# Patient Record
Sex: Female | Born: 1960
Health system: Southern US, Community
[De-identification: ages and names within clinical notes are randomized; demographics above are authoritative.]

## PROBLEM LIST (undated history)

## (undated) DIAGNOSIS — I10 Essential (primary) hypertension: Secondary | ICD-10-CM

## (undated) DIAGNOSIS — Z800 Family history of malignant neoplasm of digestive organs: Secondary | ICD-10-CM

## (undated) DIAGNOSIS — M1990 Unspecified osteoarthritis, unspecified site: Secondary | ICD-10-CM

## (undated) HISTORY — DX: Unspecified osteoarthritis, unspecified site: M19.90

## (undated) HISTORY — PX: TONSILLECTOMY AND ADENOIDECTOMY: SUR1326

## (undated) HISTORY — DX: Family history of malignant neoplasm of digestive organs: Z80.0

## (undated) HISTORY — DX: Essential (primary) hypertension: I10

## (undated) HISTORY — PX: COLONOSCOPY: SHX174

---

## 1997-11-06 HISTORY — PX: CERVICAL BIOPSY  W/ LOOP ELECTRODE EXCISION: SUR135

## 2005-09-07 ENCOUNTER — Ambulatory Visit: Payer: Self-pay | Admitting: Internal Medicine

## 2005-09-20 ENCOUNTER — Ambulatory Visit: Payer: Self-pay | Admitting: Internal Medicine

## 2006-01-19 ENCOUNTER — Ambulatory Visit: Payer: Self-pay

## 2006-02-05 ENCOUNTER — Ambulatory Visit: Payer: Self-pay

## 2007-06-18 ENCOUNTER — Ambulatory Visit: Payer: Self-pay | Admitting: Unknown Physician Specialty

## 2008-05-05 ENCOUNTER — Ambulatory Visit: Payer: Self-pay | Admitting: Internal Medicine

## 2009-06-09 ENCOUNTER — Ambulatory Visit: Payer: Self-pay

## 2011-11-21 ENCOUNTER — Ambulatory Visit: Payer: Self-pay

## 2013-02-12 ENCOUNTER — Encounter: Payer: Self-pay | Admitting: *Deleted

## 2013-02-14 ENCOUNTER — Encounter: Payer: Self-pay | Admitting: Cardiovascular Disease

## 2013-02-14 ENCOUNTER — Ambulatory Visit (INDEPENDENT_AMBULATORY_CARE_PROVIDER_SITE_OTHER): Payer: BC Managed Care – PPO | Admitting: Cardiovascular Disease

## 2013-02-14 VITALS — BP 160/80 | HR 65 | Ht 63.0 in | Wt 181.5 lb

## 2013-02-14 DIAGNOSIS — R0789 Other chest pain: Secondary | ICD-10-CM

## 2013-02-14 DIAGNOSIS — I10 Essential (primary) hypertension: Secondary | ICD-10-CM

## 2013-02-14 NOTE — Assessment & Plan Note (Signed)
Her blood pressure remains elevated but she seems to be anxious. She is already on amlodipine. If her blood pressure remains elevated, consider adding a thiazide diuretic.

## 2013-02-14 NOTE — Assessment & Plan Note (Signed)
Her chest pain is overall atypical. Physical exam is unremarkable and baseline ECG is normal. I recommend further evaluation with a treadmill stress test.

## 2013-02-14 NOTE — Patient Instructions (Addendum)
Your physician has requested that you have an exercise tolerance test. For further information please visit [email or web address]. Please also follow instruction sheet, as given.  Follow up as needed.  

## 2013-02-14 NOTE — Progress Notes (Signed)
Primary care physician: Dr. Venora Maples  HPI  This is a pleasant 52 year old Caucasian female who is referred for evaluation of chest pain. She does not have any previous cardiac history. She has minimal risk factors for coronary artery disease. She was diagnosed with hypertension early this year and was started on amlodipine. She has no history of diabetes, hyperlipidemia or tobacco use. There is family history of coronary artery disease but not premature. She describes mild chest discomfort and fatigue which can happen at rest and with physical activities. She feels that she cannot take a deep breath sometimes. She does complain of lower extremity edema at the end of the day. She does not exercise on a regular basis but stays active at work.  No Known Allergies   Current Outpatient Prescriptions on File Prior to Visit  Medication Sig Dispense Refill  . amLODipine (NORVASC) 5 MG tablet Take 5 mg by mouth daily.      . fexofenadine (ALLEGRA) 60 MG tablet Take 60 mg by mouth daily.      . Misc Natural Products (COMPLETE MENOPAUSE HEALTH PO) Take by mouth 2 (two) times daily.       No current facility-administered medications on file prior to visit.     Past Medical History  Diagnosis Date  . Unspecified essential hypertension      Past Surgical History  Procedure Laterality Date  . Colonoscopy    . Tonsillectomy and adenoidectomy       Family History  Problem Relation Age of Onset  . Hypertension Mother   . Hypertension Father   . Cancer - Colon Father      History   Social History  . Marital Status: Married    Spouse Name: N/A    Number of Children: N/A  . Years of Education: N/A   Occupational History  . Not on file.   Social History Main Topics  . Smoking status: Never Smoker   . Smokeless tobacco: Not on file  . Alcohol Use: No  . Drug Use: No  . Sexually Active: Not on file   Other Topics Concern  . Not on file   Social History Narrative  . No  narrative on file     ROS Constitutional: Negative for fever, chills, diaphoresis, activity change, appetite change and fatigue.  HENT: Negative for hearing loss, nosebleeds, congestion, sore throat, facial swelling, drooling, trouble swallowing, neck pain, voice change, sinus pressure and tinnitus.  Eyes: Negative for photophobia, pain, discharge and visual disturbance.  Respiratory: Negative for apnea, cough,  shortness of breath and wheezing.  Cardiovascular: Negative for palpitations and leg swelling.  Gastrointestinal: Negative for nausea, vomiting, abdominal pain, diarrhea, constipation, blood in stool and abdominal distention.  Genitourinary: Negative for dysuria, urgency, frequency, hematuria and decreased urine volume.  Musculoskeletal: Negative for myalgias, back pain, joint swelling, arthralgias and gait problem.  Skin: Negative for color change, pallor, rash and wound.  Neurological: Negative for dizziness, tremors, seizures, syncope, speech difficulty, weakness, light-headedness, numbness and headaches.  Psychiatric/Behavioral: Negative for suicidal ideas, hallucinations, behavioral problems and agitation. The patient is not nervous/anxious.     PHYSICAL EXAM   BP 160/80  Pulse 65  Ht 5\' 3"  (1.6 m)  Wt 181 lb 8 oz (82.328 kg)  BMI 32.16 kg/m2 Constitutional: She is oriented to person, place, and time. She appears well-developed and well-nourished. No distress.  HENT: No nasal discharge.  Head: Normocephalic and atraumatic.  Eyes: Pupils are equal and round. Right eye exhibits  no discharge. Left eye exhibits no discharge.  Neck: Normal range of motion. Neck supple. No JVD present. No thyromegaly present.  Cardiovascular: Normal rate, regular rhythm, normal heart sounds. Exam reveals no gallop and no friction rub. No murmur heard.  Pulmonary/Chest: Effort normal and breath sounds normal. No stridor. No respiratory distress. She has no wheezes. She has no rales. She  exhibits no tenderness.  Abdominal: Soft. Bowel sounds are normal. She exhibits no distension. There is no tenderness. There is no rebound and no guarding.  Musculoskeletal: Normal range of motion. She exhibits no edema and no tenderness.  Neurological: She is alert and oriented to person, place, and time. Coordination normal.  Skin: Skin is warm and dry. No rash noted. She is not diaphoretic. No erythema. No pallor.  Psychiatric: She has a normal mood and affect. Her behavior is normal. Judgment and thought content normal.     [id number]  Rhythm  Low voltage in precordial leads.   ABNORMAL     ASSESSMENT AND PLAN

## 2013-03-11 ENCOUNTER — Encounter: Payer: Self-pay | Admitting: *Deleted

## 2013-03-18 ENCOUNTER — Ambulatory Visit (INDEPENDENT_AMBULATORY_CARE_PROVIDER_SITE_OTHER): Payer: BC Managed Care – PPO | Admitting: Cardiovascular Disease

## 2013-03-18 ENCOUNTER — Encounter: Payer: Self-pay | Admitting: Cardiovascular Disease

## 2013-03-18 VITALS — BP 121/70 | HR 65 | Ht 63.0 in | Wt 179.0 lb

## 2013-03-18 DIAGNOSIS — R0789 Other chest pain: Secondary | ICD-10-CM

## 2013-03-18 NOTE — Patient Instructions (Addendum)
Your stress test is borderline   Your physician has requested that you have a stress echocardiogram. For further information please visit [email or web address] Please follow instruction sheet as given.

## 2013-03-18 NOTE — Procedures (Signed)
    Treadmill Stress test  Indication: Chest pain and dyspnea  Baseline Data:  Resting EKG shows NSR with rate of 78 bpm, no significant ST changes. Resting blood pressure of 152/80 mm Hg Stand bruce protocal was used.  Exercise Data:  Patient exercised for 6 min 23 sec,  Peak heart rate of 146 bpm.  This was 87 % of the maximum predicted heart rate. No symptoms of chest pain or lightheadedness were reported at peak stress or in recovery.  Peak Blood pressure recorded was 188/82 Maximal work level: 7 METs.  Heart rate at 3 minutes in recovery was 85 bpm. BP response: Normal HR response: Normal  EKG with Exercise: Sinus tachycardia with 1 mm of horizontal ST depression in the inferior leads as well as V5 to V6. This resolved within 3 minutes of recovery  FINAL IMPRESSION: Abnormal exercise stress test. Ischemic EKG changes in the inferior leads without any associated chest pain. Good exercise tolerance.  Recommendation: This could be a false-positive test especially that she is female. I had a prolonged discussion with her about further management options. I recommend a stress echocardiogram.

## 2013-05-08 ENCOUNTER — Other Ambulatory Visit: Payer: BC Managed Care – PPO | Admitting: Cardiovascular Disease

## 2013-05-08 ENCOUNTER — Other Ambulatory Visit (INDEPENDENT_AMBULATORY_CARE_PROVIDER_SITE_OTHER): Payer: BC Managed Care – PPO

## 2013-05-08 ENCOUNTER — Other Ambulatory Visit: Payer: BC Managed Care – PPO

## 2013-05-08 DIAGNOSIS — R0789 Other chest pain: Secondary | ICD-10-CM

## 2013-05-08 DIAGNOSIS — R079 Chest pain, unspecified: Secondary | ICD-10-CM

## 2013-05-13 ENCOUNTER — Encounter: Payer: Self-pay | Admitting: Cardiovascular Disease

## 2013-05-29 ENCOUNTER — Encounter: Payer: Self-pay | Admitting: Cardiovascular Disease

## 2015-06-22 LAB — HM MAMMOGRAPHY: HM Mammogram: NEGATIVE

## 2015-06-29 ENCOUNTER — Encounter: Payer: Self-pay | Admitting: Family Medicine

## 2015-06-29 ENCOUNTER — Ambulatory Visit (INDEPENDENT_AMBULATORY_CARE_PROVIDER_SITE_OTHER): Payer: BLUE CROSS/BLUE SHIELD | Admitting: Family Medicine

## 2015-06-29 VITALS — BP 150/75 | HR 56 | Temp 97.8°F | Resp 16 | Ht 62.0 in | Wt 179.2 lb

## 2015-06-29 DIAGNOSIS — M8949 Other hypertrophic osteoarthropathy, multiple sites: Secondary | ICD-10-CM

## 2015-06-29 DIAGNOSIS — M1990 Unspecified osteoarthritis, unspecified site: Secondary | ICD-10-CM | POA: Insufficient documentation

## 2015-06-29 DIAGNOSIS — I10 Essential (primary) hypertension: Secondary | ICD-10-CM | POA: Diagnosis not present

## 2015-06-29 DIAGNOSIS — M159 Polyosteoarthritis, unspecified: Secondary | ICD-10-CM

## 2015-06-29 DIAGNOSIS — M150 Primary generalized (osteo)arthritis: Secondary | ICD-10-CM | POA: Diagnosis not present

## 2015-06-29 MED ORDER — MELOXICAM 7.5 MG PO TABS: 7.5000 mg | ORAL_TABLET | Freq: Every day | ORAL | Status: DC

## 2015-06-29 MED ORDER — AMLODIPINE BESYLATE 2.5 MG PO TABS: 2.5000 mg | ORAL_TABLET | Freq: Every day | ORAL | Status: DC

## 2015-06-29 NOTE — Patient Instructions (Signed)
Continue other medications.  Discussed continued weight loss.

## 2015-06-29 NOTE — Progress Notes (Signed)
Name: [name]   MRN: [id number]    DOB: 1961-02-12   Date:06/29/2015       Progress Note  Subjective  Chief Complaint  Chief Complaint  Patient presents with  . Hypertension  . Arthritis    hand- refill meloxicam     HPI  Here for f/u of HBP.  C/o some increased OA pain, esp. In hands.Otherwise feeling well.  No problem-specific assessment & plan notes found for this encounter.   Past Medical History  Diagnosis Date  . Unspecified essential hypertension     Social History  Substance Use Topics  . Smoking status: Never Smoker   . Smokeless tobacco: Never Used  . Alcohol Use: No     Current outpatient prescriptions:  Marland Kitchen  Dermatological Products, Misc. (NUVAIL) SOLN, , Disp: , Rfl: 10 .  fexofenadine (ALLEGRA) 60 MG tablet, Take 60 mg by mouth daily., Disp: , Rfl:  .  Fish Oil-Cholecalciferol (FISH OIL + D3 PO), Take by mouth., Disp: , Rfl:  .  hydrochlorothiazide (HYDRODIURIL) 25 MG tablet, , Disp: , Rfl: 7 .  losartan (COZAAR) 100 MG tablet, , Disp: , Rfl: 2 .  meloxicam (MOBIC) 7.5 MG tablet, Take 1 tablet (7.5 mg total) by mouth daily., Disp: 30 tablet, Rfl: 3 .  Misc Natural Products (COMPLETE MENOPAUSE HEALTH PO), Take by mouth 2 (two) times daily., Disp: , Rfl:  .  naproxen sodium (ANAPROX) 220 MG tablet, Take 220 mg by mouth as needed., Disp: , Rfl:   No Known Allergies  Review of Systems  Constitutional: Negative for fever and chills.  HENT: Negative for hearing loss.   Eyes: Negative for blurred vision and double vision.  Respiratory: Negative for cough, sputum production, shortness of breath and wheezing.   Cardiovascular: Negative for chest pain, palpitations, orthopnea and leg swelling.  Gastrointestinal: Negative for heartburn, nausea, vomiting, abdominal pain and blood in stool.  Genitourinary: Negative for dysuria, urgency and frequency.  Musculoskeletal: Positive for joint pain. Negative for myalgias.  Skin: Negative for rash.  Neurological:  Negative for dizziness, tingling and headaches.  Psychiatric/Behavioral: Negative for depression. The patient is not nervous/anxious.        Objective  Filed Vitals:   06/29/15 0842  BP: 143/76  Pulse: 56  Temp: 97.8 F (36.6 C)  Resp: 16  Height: 5\' 2"  (1.575 m)  Weight: 179 lb 3.2 oz (81.285 kg)     Physical Exam  Constitutional: She is oriented to person, place, and time and well-developed, well-nourished, and in no distress. No distress.  HENT:  Head: Normocephalic and atraumatic.  Eyes: Conjunctivae and EOM are normal. Pupils are equal, round, and reactive to light. No scleral icterus.  Neck: Normal range of motion. Neck supple. Carotid bruit is not present. No thyromegaly present.  Cardiovascular: Normal rate, regular rhythm, normal heart sounds and intact distal pulses.  Exam reveals no gallop and no friction rub.   No murmur heard. Pulmonary/Chest: Effort normal and breath sounds normal. No respiratory distress. She has no wheezes. She has no rales.  Abdominal: Soft. Bowel sounds are normal. She exhibits no distension and no mass. There is no tenderness.  Musculoskeletal: Normal range of motion. She exhibits no edema.  Lymphadenopathy:    She has no cervical adenopathy.  Neurological: She is alert and oriented to person, place, and time.  Vitals reviewed.      No results found for this or any previous visit (from the past 2160 hour(s)).   Assessment & Plan  1. Essential hypertension  - amLODipine (NORVASC) 2.5 MG tablet; Take 1 tablet (2.5 mg total) by mouth daily.  Dispense: 90 tablet; Refill: 3  2. Primary osteoarthritis involving multiple joints

## 2015-08-06 ENCOUNTER — Other Ambulatory Visit: Payer: Self-pay | Admitting: Family Medicine

## 2015-08-10 ENCOUNTER — Ambulatory Visit (INDEPENDENT_AMBULATORY_CARE_PROVIDER_SITE_OTHER): Payer: BLUE CROSS/BLUE SHIELD | Admitting: Family Medicine

## 2015-08-10 ENCOUNTER — Encounter: Payer: Self-pay | Admitting: Family Medicine

## 2015-08-10 VITALS — BP 120/72 | HR 62 | Temp 98.2°F | Resp 16 | Ht 63.0 in | Wt 179.8 lb

## 2015-08-10 DIAGNOSIS — Z23 Encounter for immunization: Secondary | ICD-10-CM | POA: Diagnosis not present

## 2015-08-10 DIAGNOSIS — M8949 Other hypertrophic osteoarthropathy, multiple sites: Secondary | ICD-10-CM

## 2015-08-10 DIAGNOSIS — I10 Essential (primary) hypertension: Secondary | ICD-10-CM

## 2015-08-10 DIAGNOSIS — M150 Primary generalized (osteo)arthritis: Secondary | ICD-10-CM | POA: Diagnosis not present

## 2015-08-10 DIAGNOSIS — M159 Polyosteoarthritis, unspecified: Secondary | ICD-10-CM

## 2015-08-10 MED ORDER — HYDROCHLOROTHIAZIDE 25 MG PO TABS: 25.0000 mg | ORAL_TABLET | Freq: Every day | ORAL | Status: DC

## 2015-08-10 MED ORDER — AMLODIPINE BESYLATE 2.5 MG PO TABS: 2.5000 mg | ORAL_TABLET | Freq: Every day | ORAL | Status: DC

## 2015-08-10 MED ORDER — LOSARTAN POTASSIUM 100 MG PO TABS: 100.0000 mg | ORAL_TABLET | Freq: Every day | ORAL | Status: DC

## 2015-08-10 NOTE — Progress Notes (Signed)
Name: [name]   MRN: [id number]    DOB: 11-21-1960   Date:08/10/2015       Progress Note  Subjective  Chief Complaint  Chief Complaint  Patient presents with  . Hypertension    HPI Here for f/u of HBP.  Feling well overall.  Some hand arthritis problems on and off.    No problem-specific assessment & plan notes found for this encounter.   Past Medical History  Diagnosis Date  . Unspecified essential hypertension     Social History  Substance Use Topics  . Smoking status: Never Smoker   . Smokeless tobacco: Never Used  . Alcohol Use: No     Current outpatient prescriptions:  .  amLODipine (NORVASC) 2.5 MG tablet, Take 1 tablet (2.5 mg total) by mouth daily., Disp: 90 tablet, Rfl: 3 .  Dermatological Products, Misc. (NUVAIL) SOLN, , Disp: , Rfl: 10 .  fexofenadine (ALLEGRA) 60 MG tablet, Take 60 mg by mouth daily., Disp: , Rfl:  .  Fish Oil-Cholecalciferol (FISH OIL + D3 PO), Take by mouth., Disp: , Rfl:  .  hydrochlorothiazide (HYDRODIURIL) 25 MG tablet, , Disp: , Rfl: 7 .  losartan (COZAAR) 100 MG tablet, TAKE 1 TABLET BY MOUTH EVERY DAY, Disp: 30 tablet, Rfl: 12 .  meloxicam (MOBIC) 7.5 MG tablet, Take 1 tablet (7.5 mg total) by mouth daily., Disp: 30 tablet, Rfl: 3 .  Misc Natural Products (COMPLETE MENOPAUSE HEALTH PO), Take by mouth 2 (two) times daily., Disp: , Rfl:  .  naproxen sodium (ANAPROX) 220 MG tablet, Take 220 mg by mouth as needed., Disp: , Rfl:   No Known Allergies  Review of Systems  Constitutional: Negative for fever, chills, weight loss and malaise/fatigue.  HENT: Negative for hearing loss.   Eyes: Negative for blurred vision and double vision.  Respiratory: Negative for cough, sputum production, shortness of breath and wheezing.   Cardiovascular: Negative for chest pain, palpitations, orthopnea and leg swelling.  Gastrointestinal: Negative for heartburn, abdominal pain and blood in stool.  Genitourinary: Negative for dysuria, urgency and  frequency.  Musculoskeletal: Positive for joint pain (hands, on and off.). Negative for myalgias.  Skin: Negative for rash.  Neurological: Negative for dizziness, sensory change, focal weakness, weakness and headaches.      Objective  Filed Vitals:   08/10/15 0810  BP: 120/72  Pulse: 62  Temp: 98.2 F (36.8 C)  TempSrc: Oral  Resp: 16  Height: 5\' 3"  (1.6 m)  Weight: 179 lb 12.8 oz (81.557 kg)     Physical Exam  Constitutional: She is oriented to person, place, and time and well-developed, well-nourished, and in no distress. No distress.  HENT:  Head: Normocephalic and atraumatic.  Eyes: Conjunctivae and EOM are normal. Pupils are equal, round, and reactive to light.  Neck: Normal range of motion. Neck supple. Carotid bruit is not present. No thyromegaly present.  Cardiovascular: Normal rate, regular rhythm, normal heart sounds and intact distal pulses.  Exam reveals no gallop and no friction rub.   No murmur heard. Pulmonary/Chest: Effort normal and breath sounds normal. No respiratory distress. She has no wheezes. She has no rales.  Abdominal: Soft. Bowel sounds are normal. She exhibits no distension, no abdominal bruit and no mass. There is no tenderness.  Musculoskeletal: She exhibits no edema.  Lymphadenopathy:    She has no cervical adenopathy.  Neurological: She is alert and oriented to person, place, and time.  Vitals reviewed.     No results found for  this or any previous visit (from the past 2160 hour(s)).   Assessment & Plan  1. Essential hypertension  - amLODipine (NORVASC) 2.5 MG tablet; Take 1 tablet (2.5 mg total) by mouth daily.  Dispense: 90 tablet; Refill: 3 - hydrochlorothiazide (HYDRODIURIL) 25 MG tablet; Take 1 tablet (25 mg total) by mouth daily.  Dispense: 90 tablet; Refill: 3 - losartan (COZAAR) 100 MG tablet; Take 1 tablet (100 mg total) by mouth daily.  Dispense: 90 tablet; Refill: 3  2. Primary osteoarthritis involving multiple  joints Use Mobic prn  3. Need for influenza vaccination  - Flu Vaccine QUAD 36+ mos PF IM (Fluarix & Fluzone Quad PF)

## 2015-08-10 NOTE — Patient Instructions (Signed)
Continue current meds.  Discussed trying more to lose weight.

## 2015-10-22 ENCOUNTER — Other Ambulatory Visit: Payer: Self-pay | Admitting: Family Medicine

## 2015-11-24 ENCOUNTER — Encounter: Payer: Self-pay | Admitting: Family Medicine

## 2015-12-16 ENCOUNTER — Encounter: Payer: Self-pay | Admitting: Family Medicine

## 2015-12-16 ENCOUNTER — Ambulatory Visit (INDEPENDENT_AMBULATORY_CARE_PROVIDER_SITE_OTHER): Payer: BLUE CROSS/BLUE SHIELD | Admitting: Family Medicine

## 2015-12-16 VITALS — BP 145/70 | HR 60 | Temp 97.4°F | Resp 16 | Ht 63.0 in | Wt 179.2 lb

## 2015-12-16 DIAGNOSIS — M150 Primary generalized (osteo)arthritis: Secondary | ICD-10-CM

## 2015-12-16 DIAGNOSIS — M159 Polyosteoarthritis, unspecified: Secondary | ICD-10-CM

## 2015-12-16 DIAGNOSIS — I10 Essential (primary) hypertension: Secondary | ICD-10-CM | POA: Diagnosis not present

## 2015-12-16 DIAGNOSIS — H9193 Unspecified hearing loss, bilateral: Secondary | ICD-10-CM | POA: Diagnosis not present

## 2015-12-16 DIAGNOSIS — H9190 Unspecified hearing loss, unspecified ear: Secondary | ICD-10-CM | POA: Insufficient documentation

## 2015-12-16 DIAGNOSIS — M8949 Other hypertrophic osteoarthropathy, multiple sites: Secondary | ICD-10-CM

## 2015-12-16 MED ORDER — AMLODIPINE BESYLATE 5 MG PO TABS: 5.0000 mg | ORAL_TABLET | Freq: Every day | ORAL | Status: DC

## 2015-12-16 NOTE — Progress Notes (Signed)
Name: [name]   MRN: [id number]    DOB: [birth year]   Date:12/16/2015       Progress Note  Subjective  Chief Complaint  Chief Complaint  Patient presents with  . Hypertension    Follow up    Hypertension Pertinent negatives include no blurred vision, chest pain, headaches, malaise/fatigue, palpitations or shortness of breath.   Here for f/u of HBP.  Feeling well.  Taking all m eds.  Arthritis bothers some.  Takes Tylenol and occ. Meloxicam.  OBTW- C/o decreased hearing and full feeling in ears.  Having some dental pocedures done.  No problem-specific assessment & plan notes found for this encounter.   Past Medical History  Diagnosis Date  . Unspecified essential hypertension     Past Surgical History  Procedure Laterality Date  . Colonoscopy    . Tonsillectomy and adenoidectomy      Family History  Problem Relation Age of Onset  . Hypertension Mother   . Hypertension Father   . Cancer - Colon Father     Social History   Social History  . Marital Status: Married    Spouse Name: N/A  . Number of Children: N/A  . Years of Education: N/A   Occupational History  . Not on file.   Social History Main Topics  . Smoking status: Never Smoker   . Smokeless tobacco: Never Used  . Alcohol Use: No  . Drug Use: No  . Sexual Activity: Not on file   Other Topics Concern  . Not on file   Social History Narrative     Current outpatient prescriptions:  .  amLODipine (NORVASC) 5 MG tablet, Take 1 tablet (5 mg total) by mouth daily., Disp: 90 tablet, Rfl: 3 .  Dermatological Products, Misc. (NUVAIL) SOLN, , Disp: , Rfl: 10 .  fexofenadine (ALLEGRA) 60 MG tablet, Take 60 mg by mouth daily., Disp: , Rfl:  .  Fish Oil-Cholecalciferol (FISH OIL + D3 PO), Take by mouth., Disp: , Rfl:  .  hydrochlorothiazide (HYDRODIURIL) 25 MG tablet, Take 1 tablet (25 mg total) by mouth daily., Disp: 90 tablet, Rfl: 3 .  losartan (COZAAR) 100 MG tablet, Take 1 tablet (100 mg total) by  mouth daily., Disp: 90 tablet, Rfl: 3 .  meloxicam (MOBIC) 7.5 MG tablet, TAKE 1 TABLET(7.5 MG) BY MOUTH DAILY, Disp: 30 tablet, Rfl: 3 .  naproxen sodium (ANAPROX) 220 MG tablet, Take 220 mg by mouth as needed. Reported on 12/16/2015, Disp: , Rfl:   Not on File   Review of Systems  Constitutional: Negative for fever, chills, weight loss and malaise/fatigue.  HENT: Negative for hearing loss.   Eyes: Negative for blurred vision and double vision.  Respiratory: Negative for cough, shortness of breath and wheezing.   Cardiovascular: Positive for leg swelling (L ankle swelling some.). Negative for chest pain and palpitations.  Gastrointestinal: Negative for heartburn, nausea, abdominal pain and blood in stool.  Genitourinary: Negative for dysuria, urgency and frequency.  Musculoskeletal: Positive for joint pain (occ. hand arthritis).  Skin: Negative for rash.  Neurological: Negative for tremors, weakness and headaches.      Objective  Filed Vitals:   12/16/15 0817 12/16/15 0839  BP: 146/80 145/70  Pulse: 60   Temp: 97.4 F (36.3 C)   TempSrc: Oral   Resp: 16   Height: 5\' 3"  (1.6 m)   Weight: 179 lb 3.2 oz (81.285 kg)     Physical Exam  Constitutional: She is oriented to person, place,  and time and well-developed, well-nourished, and in no distress. No distress.  HENT:  Head: Normocephalic and atraumatic.  Right Ear: Tympanic membrane, external ear and ear canal normal. Decreased hearing is noted.  Left Ear: Tympanic membrane, external ear and ear canal normal. Decreased hearing is noted.  Eyes: Conjunctivae and EOM are normal. Pupils are equal, round, and reactive to light.  Neck: Normal range of motion. Neck supple. Carotid bruit is not present. No thyromegaly present.  Cardiovascular: Normal rate, regular rhythm and normal heart sounds.  Exam reveals no gallop and no friction rub.   No murmur heard. Pulmonary/Chest: Effort normal and breath sounds normal. No respiratory  distress. She has no wheezes. She has no rales.  Abdominal: Soft. She exhibits no distension, no abdominal bruit and no mass. There is no tenderness.  Musculoskeletal: She exhibits edema (trace edema R ankle).  Lymphadenopathy:    She has no cervical adenopathy.  Neurological: She is alert and oriented to person, place, and time.  Vitals reviewed.      No results found for this or any previous visit (from the past 2160 hour(s)).   Assessment & Plan  Problem List Items Addressed This Visit      Cardiovascular and Mediastinum   Essential hypertension - Primary   Relevant Medications   amLODipine (NORVASC) 5 MG tablet     Musculoskeletal and Integument   Osteoarthritis     Other   Decreased hearing   Relevant Orders   Ambulatory referral to ENT      Meds ordered this encounter  Medications  . amLODipine (NORVASC) 5 MG tablet    Sig: Take 1 tablet (5 mg total) by mouth daily.    Dispense:  90 tablet    Refill:  3  1. Essential hypertension Cont. HCTZ and Losartan - amLODipine (NORVASC) 5 MG tablet; Take 1 tablet (5 mg total) by mouth daily.  Dispense: 90 tablet; Refill: 3  2. Primary osteoarthritis involving multiple joints Cont. tylenol and rare Meloxicam  3. Decreased hearing, bilateral  - Ambulatory referral to ENT

## 2016-02-23 ENCOUNTER — Other Ambulatory Visit: Payer: Self-pay | Admitting: Family Medicine

## 2016-03-02 ENCOUNTER — Encounter: Payer: Self-pay | Admitting: Family Medicine

## 2016-03-02 ENCOUNTER — Ambulatory Visit (INDEPENDENT_AMBULATORY_CARE_PROVIDER_SITE_OTHER): Payer: BLUE CROSS/BLUE SHIELD | Admitting: Family Medicine

## 2016-03-02 VITALS — BP 130/70 | HR 57 | Resp 16 | Ht 63.0 in | Wt 177.0 lb

## 2016-03-02 DIAGNOSIS — M150 Primary generalized (osteo)arthritis: Secondary | ICD-10-CM

## 2016-03-02 DIAGNOSIS — I10 Essential (primary) hypertension: Secondary | ICD-10-CM

## 2016-03-02 DIAGNOSIS — M8949 Other hypertrophic osteoarthropathy, multiple sites: Secondary | ICD-10-CM

## 2016-03-02 DIAGNOSIS — M159 Polyosteoarthritis, unspecified: Secondary | ICD-10-CM

## 2016-03-02 MED ORDER — AMLODIPINE BESYLATE 2.5 MG PO TABS: 2.5000 mg | ORAL_TABLET | Freq: Every day | ORAL | Status: DC

## 2016-03-02 NOTE — Progress Notes (Signed)
Name: [name]   MRN: [id number]    DOB: 03-18-1961   Date:03/02/2016       Progress Note  Subjective  Chief Complaint  Chief Complaint  Patient presents with  . Hypertension    HPI Here for f/u of HBP.  Feeling well overall.  Gets swelling in feet and adjusts dose of Amlodipine between 2.5 mg and 5 mg.  No problem-specific assessment & plan notes found for this encounter.   Past Medical History  Diagnosis Date  . Unspecified essential hypertension     Past Surgical History  Procedure Laterality Date  . Colonoscopy    . Tonsillectomy and adenoidectomy      Family History  Problem Relation Age of Onset  . Hypertension Mother   . Hypertension Father   . Cancer - Colon Father     Social History   Social History  . Marital Status: Married    Spouse Name: N/A  . Number of Children: N/A  . Years of Education: N/A   Occupational History  . Not on file.   Social History Main Topics  . Smoking status: Never Smoker   . Smokeless tobacco: Never Used  . Alcohol Use: No  . Drug Use: No  . Sexual Activity: Not on file   Other Topics Concern  . Not on file   Social History Narrative     Current outpatient prescriptions:  .  acetaminophen (TYLENOL) 325 MG tablet, Take 650 mg by mouth every 6 (six) hours as needed., Disp: , Rfl:  .  amLODipine (NORVASC) 2.5 MG tablet, Take 1 tablet (2.5 mg total) by mouth daily., Disp: 90 tablet, Rfl: 3 .  Dermatological Products, Misc. (NUVAIL) SOLN, , Disp: , Rfl: 10 .  fexofenadine (ALLEGRA) 60 MG tablet, Take 60 mg by mouth daily., Disp: , Rfl:  .  Fish Oil-Cholecalciferol (FISH OIL + D3 PO), Take by mouth., Disp: , Rfl:  .  fluticasone (FLONASE) 50 MCG/ACT nasal spray, , Disp: , Rfl: 7 .  hydrochlorothiazide (HYDRODIURIL) 25 MG tablet, Take 1 tablet (25 mg total) by mouth daily., Disp: 90 tablet, Rfl: 3 .  losartan (COZAAR) 100 MG tablet, Take 1 tablet (100 mg total) by mouth daily., Disp: 90 tablet, Rfl: 3 .  meloxicam  (MOBIC) 7.5 MG tablet, TAKE 1 TABLET(7.5 MG) BY MOUTH DAILY (Patient taking differently: take daily as needed), Disp: 30 tablet, Rfl: 0  Not on File   Review of Systems  Constitutional: Negative for fever, chills, weight loss and malaise/fatigue.  HENT: Negative for hearing loss.   Eyes: Negative for blurred vision and double vision.  Respiratory: Negative for cough, shortness of breath and wheezing.   Cardiovascular: Negative for chest pain, palpitations and leg swelling.  Gastrointestinal: Negative for heartburn, abdominal pain and blood in stool.  Genitourinary: Negative for dysuria, urgency and frequency.  Musculoskeletal: Negative for myalgias.  Skin: Negative for rash.  Neurological: Negative for tremors, weakness and headaches.      Objective  Filed Vitals:   03/02/16 0840 03/02/16 0938  BP: 138/68 130/70  Pulse: 57   Resp: 16   Height: 5\' 3"  (1.6 m)   Weight: 177 lb (80.287 kg)     Physical Exam  Constitutional: She is oriented to person, place, and time and well-developed, well-nourished, and in no distress. No distress.  HENT:  Head: Normocephalic and atraumatic.  Eyes: Conjunctivae and EOM are normal. Pupils are equal, round, and reactive to light. No scleral icterus.  Neck: Normal range  of motion. Neck supple. Carotid bruit is not present. No thyromegaly present.  Cardiovascular: Regular rhythm and normal heart sounds.  Bradycardia present.  Exam reveals no gallop and no friction rub.   No murmur heard. Pulmonary/Chest: Effort normal and breath sounds normal. No respiratory distress. She has no wheezes. She has no rales.  Musculoskeletal: She exhibits no edema.  Lymphadenopathy:    She has no cervical adenopathy.  Neurological: She is alert and oriented to person, place, and time.  Vitals reviewed.      No results found for this or any previous visit (from the past 2160 hour(s)).   Assessment & Plan  Problem List Items Addressed This Visit       Cardiovascular and Mediastinum   Essential hypertension - Primary   Relevant Medications   amLODipine (NORVASC) 2.5 MG tablet     Musculoskeletal and Integument   Osteoarthritis   Relevant Medications   acetaminophen (TYLENOL) 325 MG tablet      Meds ordered this encounter  Medications  . DISCONTD: amLODipine (NORVASC) 2.5 MG tablet    Sig:     Refill:  2  . fluticasone (FLONASE) 50 MCG/ACT nasal spray    Sig:     Refill:  7  . acetaminophen (TYLENOL) 325 MG tablet    Sig: Take 650 mg by mouth every 6 (six) hours as needed.  Marland Kitchen amLODipine (NORVASC) 2.5 MG tablet    Sig: Take 1 tablet (2.5 mg total) by mouth daily.    Dispense:  90 tablet    Refill:  3   1. Essential hypertension  - amLODipine (NORVASC) 2.5 MG tablet; Take 1 tablet (2.5 mg total) by mouth daily.  Dispense: 90 tablet; Refill: 3   Discontinue Amlodipine 5 mg. Cont. Losartan and HCTZ.  2. Primary osteoarthritis involving multiple joints

## 2016-03-22 ENCOUNTER — Other Ambulatory Visit: Payer: Self-pay | Admitting: Family Medicine

## 2016-05-18 ENCOUNTER — Ambulatory Visit (INDEPENDENT_AMBULATORY_CARE_PROVIDER_SITE_OTHER): Payer: BLUE CROSS/BLUE SHIELD | Admitting: Family Medicine

## 2016-05-18 ENCOUNTER — Other Ambulatory Visit: Payer: Self-pay | Admitting: Family Medicine

## 2016-05-18 ENCOUNTER — Encounter: Payer: Self-pay | Admitting: Family Medicine

## 2016-05-18 VITALS — BP 130/80 | HR 61 | Temp 98.4°F | Resp 16 | Ht 63.0 in | Wt 173.0 lb

## 2016-05-18 DIAGNOSIS — I10 Essential (primary) hypertension: Secondary | ICD-10-CM

## 2016-05-18 DIAGNOSIS — M159 Polyosteoarthritis, unspecified: Secondary | ICD-10-CM

## 2016-05-18 DIAGNOSIS — M8949 Other hypertrophic osteoarthropathy, multiple sites: Secondary | ICD-10-CM

## 2016-05-18 DIAGNOSIS — M150 Primary generalized (osteo)arthritis: Secondary | ICD-10-CM

## 2016-05-18 DIAGNOSIS — R739 Hyperglycemia, unspecified: Secondary | ICD-10-CM | POA: Diagnosis not present

## 2016-05-18 NOTE — Progress Notes (Signed)
Name: [name]   MRN: [id number]    DOB: 15-Jun-1961   Date:05/18/2016       Progress Note  Subjective  Chief Complaint  Chief Complaint  Patient presents with  . Hypertension    HPI Here for f/u of HBP.  Taking all meds.  Feeling well.  No problem-specific assessment & plan notes found for this encounter.   Past Medical History  Diagnosis Date  . Unspecified essential hypertension     Past Surgical History  Procedure Laterality Date  . Colonoscopy    . Tonsillectomy and adenoidectomy      Family History  Problem Relation Age of Onset  . Hypertension Mother   . Hypertension Father   . Cancer - Colon Father     Social History   Social History  . Marital Status: Married    Spouse Name: N/A  . Number of Children: N/A  . Years of Education: N/A   Occupational History  . Not on file.   Social History Main Topics  . Smoking status: Never Smoker   . Smokeless tobacco: Never Used  . Alcohol Use: No  . Drug Use: No  . Sexual Activity: Not on file   Other Topics Concern  . Not on file   Social History Narrative     Current outpatient prescriptions:  .  acetaminophen (TYLENOL) 325 MG tablet, Take 650 mg by mouth every 6 (six) hours as needed., Disp: , Rfl:  .  amLODipine (NORVASC) 2.5 MG tablet, Take 1 tablet (2.5 mg total) by mouth daily., Disp: 90 tablet, Rfl: 3 .  Dermatological Products, Misc. (NUVAIL) SOLN, , Disp: , Rfl: 10 .  fexofenadine (ALLEGRA) 60 MG tablet, Take 60 mg by mouth daily., Disp: , Rfl:  .  Fish Oil-Cholecalciferol (FISH OIL + D3 PO), Take by mouth., Disp: , Rfl:  .  fluticasone (FLONASE) 50 MCG/ACT nasal spray, , Disp: , Rfl: 7 .  hydrochlorothiazide (HYDRODIURIL) 25 MG tablet, Take 1 tablet (25 mg total) by mouth daily., Disp: 90 tablet, Rfl: 3 .  losartan (COZAAR) 100 MG tablet, Take 1 tablet (100 mg total) by mouth daily., Disp: 90 tablet, Rfl: 3 .  meloxicam (MOBIC) 7.5 MG tablet, TAKE 1 TABLET(7.5 MG) BY MOUTH DAILY, Disp: 30  tablet, Rfl: 6  Not on File   Review of Systems  Constitutional: Negative for fever, chills, weight loss and malaise/fatigue.  HENT: Negative for hearing loss.   Eyes: Negative for blurred vision and double vision.  Respiratory: Negative for cough, hemoptysis and wheezing.   Cardiovascular: Negative for chest pain, palpitations and leg swelling.  Gastrointestinal: Negative for heartburn, abdominal pain and blood in stool.  Genitourinary: Negative for dysuria, urgency and frequency.  Skin: Negative for rash.  Neurological: Negative for dizziness, tremors, weakness and headaches.      Objective  Filed Vitals:   05/18/16 0858 05/18/16 0937  BP: 121/78 130/80  Pulse: 61   Temp: 98.4 F (36.9 C)   TempSrc: Oral   Resp: 16   Height: 5\' 3"  (1.6 m)   Weight: 173 lb (78.472 kg)     Physical Exam  Constitutional: She is oriented to person, place, and time and well-developed, well-nourished, and in no distress. No distress.  HENT:  Head: Normocephalic and atraumatic.  Eyes: Conjunctivae and EOM are normal. Pupils are equal, round, and reactive to light. No scleral icterus.  Neck: Normal range of motion. Neck supple. Carotid bruit is not present. No thyromegaly present.  Cardiovascular: Normal  rate, regular rhythm and normal heart sounds.  Exam reveals no gallop and no friction rub.   No murmur heard. Pulmonary/Chest: Effort normal and breath sounds normal. No respiratory distress. She has no wheezes. She has no rales.  Abdominal: Soft. Bowel sounds are normal. She exhibits no distension, no abdominal bruit and no mass. There is no tenderness.  Musculoskeletal: She exhibits no edema.  Lymphadenopathy:    She has no cervical adenopathy.  Neurological: She is alert and oriented to person, place, and time.  Vitals reviewed.      No results found for this or any previous visit (from the past 2160 hour(s)).   Assessment & Plan  Problem List Items Addressed This Visit       Cardiovascular and Mediastinum   Essential hypertension - Primary   Relevant Orders   COMPLETE METABOLIC PANEL WITH GFR   Lipid Profile     Musculoskeletal and Integument   Osteoarthritis   Relevant Orders   CBC with Differential      No orders of the defined types were placed in this encounter.   1. Essential hypertension Cont Losartan, HCTZ, and Amlodipine  2. Primary osteoarthritis involving multiple joints Cont Mobic prn

## 2016-05-19 ENCOUNTER — Other Ambulatory Visit: Payer: BLUE CROSS/BLUE SHIELD

## 2016-05-19 LAB — COMPLETE METABOLIC PANEL WITH GFR
ALBUMIN: 3.9 g/dL (ref 3.6–5.1)
ALT: 13 U/L (ref 6–29)
AST: 17 U/L (ref 10–35)
Alkaline Phosphatase: 40 U/L (ref 33–130)
BUN: 12 mg/dL (ref 7–25)
CHLORIDE: 105 mmol/L (ref 98–110)
CO2: 27 mmol/L (ref 20–31)
CREATININE: 0.59 mg/dL (ref 0.50–1.05)
Calcium: 8.9 mg/dL (ref 8.6–10.4)
GFR, Est African American: >89 mL/min (ref >=60)
GFR, Est Non African American: >89 mL/min (ref >=60)
GLUCOSE: 104 mg/dL — AB (ref 65–99)
POTASSIUM: 3.9 mmol/L (ref 3.5–5.3)
SODIUM: 142 mmol/L (ref 135–146)
Total Bilirubin: 0.4 mg/dL (ref 0.2–1.2)
Total Protein: 6.5 g/dL (ref 6.1–8.1)

## 2016-05-19 LAB — CBC WITH DIFFERENTIAL/PLATELET
BASOS PCT: 1 %
Basophils Absolute: 51 cells/uL (ref 0–200)
EOS PCT: 2 %
Eosinophils Absolute: 102 cells/uL (ref 15–500)
HCT: 39.6 % (ref 35.0–45.0)
Hemoglobin: 13.7 g/dL (ref 11.7–15.5)
LYMPHS PCT: 40 %
Lymphs Abs: 2040 cells/uL (ref 850–3900)
MCH: 28.7 pg (ref 27.0–33.0)
MCHC: 34.6 g/dL (ref 32.0–36.0)
MCV: 83 fL (ref 80.0–100.0)
MONOS PCT: 10 %
MPV: 9.9 fL (ref 7.5–12.5)
Monocytes Absolute: 510 cells/uL (ref 200–950)
Neutro Abs: 2397 cells/uL (ref 1500–7800)
Neutrophils Relative %: 47 %
PLATELETS: 236 10*3/uL (ref 140–400)
RBC: 4.77 MIL/uL (ref 3.80–5.10)
RDW: 14 % (ref 11.0–15.0)
WBC: 5.1 10*3/uL (ref 3.8–10.8)

## 2016-05-19 LAB — LIPID PANEL
CHOL/HDL RATIO: 3.3 ratio (ref <=5.0)
Cholesterol: 202 mg/dL — ABNORMAL HIGH (ref 125–200)
HDL: 62 mg/dL (ref >=46)
LDL Cholesterol: 131 mg/dL — ABNORMAL HIGH (ref <130)
Triglycerides: 43 mg/dL (ref <150)
VLDL: 9 mg/dL (ref <30)

## 2016-05-22 LAB — HEMOGLOBIN A1C
Hgb A1c MFr Bld: 5.7 % — ABNORMAL HIGH (ref <5.7)
MEAN PLASMA GLUCOSE: 117 mg/dL

## 2016-07-29 ENCOUNTER — Other Ambulatory Visit: Payer: Self-pay | Admitting: Family Medicine

## 2016-07-29 DIAGNOSIS — I10 Essential (primary) hypertension: Secondary | ICD-10-CM

## 2016-08-22 ENCOUNTER — Other Ambulatory Visit: Payer: Self-pay | Admitting: Family Medicine

## 2016-09-19 ENCOUNTER — Ambulatory Visit (INDEPENDENT_AMBULATORY_CARE_PROVIDER_SITE_OTHER): Payer: BLUE CROSS/BLUE SHIELD | Admitting: Family Medicine

## 2016-09-19 ENCOUNTER — Encounter: Payer: Self-pay | Admitting: Family Medicine

## 2016-09-19 VITALS — BP 135/75 | HR 56 | Temp 97.7°F | Resp 16 | Ht 63.0 in | Wt 177.0 lb

## 2016-09-19 DIAGNOSIS — I10 Essential (primary) hypertension: Secondary | ICD-10-CM

## 2016-09-19 DIAGNOSIS — G629 Polyneuropathy, unspecified: Secondary | ICD-10-CM | POA: Diagnosis not present

## 2016-09-19 DIAGNOSIS — M150 Primary generalized (osteo)arthritis: Secondary | ICD-10-CM

## 2016-09-19 DIAGNOSIS — M159 Polyosteoarthritis, unspecified: Secondary | ICD-10-CM

## 2016-09-19 DIAGNOSIS — Z23 Encounter for immunization: Secondary | ICD-10-CM | POA: Diagnosis not present

## 2016-09-19 DIAGNOSIS — M8949 Other hypertrophic osteoarthropathy, multiple sites: Secondary | ICD-10-CM

## 2016-09-19 NOTE — Progress Notes (Signed)
Name: [name]   MRN: [id number]    DOB: 1961-08-20   Date:09/19/2016       Progress Note  Subjective  Chief Complaint  Chief Complaint  Patient presents with  . Hypertension    HPI Here for f/u of HBP.  She is having a dental implant done next week. She is feeling well.  C/o L foot being less coordinated that R.  Difficult to move L foot and leg, especially rapidly.  She drags L foot occ. No problem-specific Assessment & Plan notes found for this encounter.   Past Medical History:  Diagnosis Date  . Unspecified essential hypertension     Past Surgical History:  Procedure Laterality Date  . COLONOSCOPY    . TONSILLECTOMY AND ADENOIDECTOMY      Family History  Problem Relation Age of Onset  . Hypertension Mother   . Hypertension Father   . Cancer - Colon Father     Social History   Social History  . Marital status: Married    Spouse name: N/A  . Number of children: N/A  . Years of education: N/A   Occupational History  . Not on file.   Social History Main Topics  . Smoking status: Never Smoker  . Smokeless tobacco: Never Used  . Alcohol use No  . Drug use: No  . Sexual activity: Yes   Other Topics Concern  . Not on file   Social History Narrative  . No narrative on file     Current Outpatient Prescriptions:  .  acetaminophen (TYLENOL) 325 MG tablet, Take 650 mg by mouth every 6 (six) hours as needed., Disp: , Rfl:  .  amLODipine (NORVASC) 2.5 MG tablet, Take 1 tablet (2.5 mg total) by mouth daily., Disp: 90 tablet, Rfl: 3 .  amoxicillin (AMOXIL) 500 MG capsule, Take 500 mg by mouth 4 (four) times daily., Disp: , Rfl: 0 .  Dermatological Products, Misc. (NUVAIL) SOLN, , Disp: , Rfl: 10 .  fexofenadine (ALLEGRA) 60 MG tablet, Take 60 mg by mouth daily., Disp: , Rfl:  .  Fish Oil-Cholecalciferol (FISH OIL + D3 PO), Take by mouth., Disp: , Rfl:  .  fluticasone (FLONASE) 50 MCG/ACT nasal spray, , Disp: , Rfl: 7 .  hydrochlorothiazide (HYDRODIURIL)  25 MG tablet, TAKE 1 TABLET(25 MG) BY MOUTH DAILY, Disp: 90 tablet, Rfl: 3 .  losartan (COZAAR) 100 MG tablet, TAKE 1 TABLET BY MOUTH EVERY DAY, Disp: 90 tablet, Rfl: 3 .  meloxicam (MOBIC) 7.5 MG tablet, TAKE 1 TABLET(7.5 MG) BY MOUTH DAILY, Disp: 30 tablet, Rfl: 6  Not on File   Review of Systems  Constitutional: Negative for chills, fever, malaise/fatigue and weight loss.  HENT: Negative for hearing loss and tinnitus.   Eyes: Negative for blurred vision and double vision.  Respiratory: Negative for cough, shortness of breath and wheezing.   Cardiovascular: Negative for chest pain, palpitations and leg swelling.  Gastrointestinal: Negative for abdominal pain, blood in stool and heartburn.  Genitourinary: Negative for dysuria, frequency and urgency.  Musculoskeletal: Negative for joint pain and myalgias.  Skin: Negative for rash.  Neurological: Negative for dizziness, tingling, tremors, weakness and headaches.      Objective  Vitals:   09/19/16 0842 09/19/16 0906  BP: 138/76 135/75  Pulse: (!) 56   Resp: 16   Temp: 97.7 F (36.5 C)   TempSrc: Oral   Weight: 80.3 kg (177 lb)   Height: 5\' 3"  (1.6 m)     Physical Exam  Constitutional: She is well-developed, well-nourished, and in no distress. No distress.  HENT:  Head: Normocephalic and atraumatic.  Neurological:  L foot not able to do rapid alternating movement as compared to R.  +/- L foot sl weaker than R.  Vitals reviewed.      No results found for this or any previous visit (from the past 2160 hour(s)).   Assessment & Plan  Problem List Items Addressed This Visit      Cardiovascular and Mediastinum   Essential hypertension     Nervous and Auditory   Neuropathy ([place])   Relevant Orders   Ambulatory referral to Neurology     Musculoskeletal and Integument   Osteoarthritis    Other Visit Diagnoses    Need for vaccination    -  Primary   Relevant Orders   Flu Vaccine QUAD 36+ mos PF IM (Fluarix &  Fluzone Quad PF) (Completed)      Meds ordered this encounter  Medications  . amoxicillin (AMOXIL) 500 MG capsule    Sig: Take 500 mg by mouth 4 (four) times daily.    Refill:  0   1. Need for vaccination  - Flu Vaccine QUAD 36+ mos PF IM (Fluarix & Fluzone Quad PF)  2. Essential hypertension Cont HCTZ, Losartan, Amlidipine.  3. Primary osteoarthritis involving multiple joints   4. Neuropathy ([place])  - Ambulatory referral to Neurology

## 2016-10-10 DIAGNOSIS — M21372 Foot drop, left foot: Secondary | ICD-10-CM | POA: Diagnosis not present

## 2016-10-13 ENCOUNTER — Other Ambulatory Visit: Payer: Self-pay | Admitting: Neurology

## 2016-10-13 DIAGNOSIS — M21372 Foot drop, left foot: Secondary | ICD-10-CM

## 2016-10-16 ENCOUNTER — Other Ambulatory Visit: Payer: Self-pay | Admitting: Neurology

## 2016-10-16 DIAGNOSIS — M21372 Foot drop, left foot: Secondary | ICD-10-CM

## 2016-10-21 ENCOUNTER — Other Ambulatory Visit: Payer: Self-pay | Admitting: Family Medicine

## 2016-10-26 ENCOUNTER — Telehealth: Payer: Self-pay | Admitting: Family Medicine

## 2016-10-26 NOTE — Telephone Encounter (Signed)
Called patient back, spoke with [name], she stated that her grandson (contact and carrying for him in past 24 hours), was diagnosed positive Influenza A after known exposure at daycare, test today, and given treatment with Tamiflu, patient was asymptomatic. Other family members given prophylaxis dose Tamiflu. Now [name] is requesting same prophylaxis tamiflu for her and her husband, [name] 10/19/1961. They are both asymptomatic at this time, I sent in one rx for [name] for Tamiflu 75mg  BID for 5 days #10, alternatively may take 1 cap BID for 5 days and may share with wife for prophylaxis dose each, advised to notify us if symptoms or seek medical treatment at urgent care if after hours and we are closed and they need additional treatment or confirmatory testing.  [name], [facility] 10/26/2016, 12:09 PM

## 2016-10-26 NOTE — Telephone Encounter (Signed)
Pt has been keeping grandson and he was diagnosed with the flu today.  His doctor recommended asking about getting tamiflu for herself.  Her call back number is [phone]

## 2016-11-01 DIAGNOSIS — M21372 Foot drop, left foot: Secondary | ICD-10-CM | POA: Diagnosis not present

## 2016-11-06 HISTORY — PX: BRAIN SURGERY: SHX531

## 2016-11-08 ENCOUNTER — Ambulatory Visit
Admission: RE | Admit: 2016-11-08 | Discharge: 2016-11-08 | Disposition: A | Discharge status: Home / Self Care | Payer: BLUE CROSS/BLUE SHIELD | Source: Ambulatory Visit | Attending: Neurology | Admitting: Neurology

## 2016-11-08 ENCOUNTER — Other Ambulatory Visit: Payer: Self-pay | Admitting: Neurology

## 2016-11-08 DIAGNOSIS — M21372 Foot drop, left foot: Secondary | ICD-10-CM

## 2016-11-08 DIAGNOSIS — M7989 Other specified soft tissue disorders: Secondary | ICD-10-CM

## 2016-11-08 DIAGNOSIS — D320 Benign neoplasm of cerebral meninges: Secondary | ICD-10-CM | POA: Insufficient documentation

## 2016-11-08 DIAGNOSIS — D329 Benign neoplasm of meninges, unspecified: Secondary | ICD-10-CM | POA: Diagnosis not present

## 2016-11-08 DIAGNOSIS — M79605 Pain in left leg: Secondary | ICD-10-CM

## 2016-11-08 LAB — POCT I-STAT CREATININE: CREATININE: 0.8 mg/dL (ref 0.44–1.00)

## 2016-11-08 MED ORDER — GADOBENATE DIMEGLUMINE 529 MG/ML IV SOLN
20.0000 mL | Freq: Once | INTRAVENOUS | Status: AC | PRN
  Administered 2016-11-08: 16 mL via INTRAVENOUS

## 2016-11-17 DIAGNOSIS — D329 Benign neoplasm of meninges, unspecified: Secondary | ICD-10-CM | POA: Diagnosis not present

## 2016-11-20 ENCOUNTER — Other Ambulatory Visit: Payer: Self-pay | Admitting: Family Medicine

## 2016-12-07 DIAGNOSIS — D320 Benign neoplasm of cerebral meninges: Secondary | ICD-10-CM | POA: Diagnosis not present

## 2016-12-07 DIAGNOSIS — D329 Benign neoplasm of meninges, unspecified: Secondary | ICD-10-CM | POA: Diagnosis not present

## 2016-12-11 DIAGNOSIS — M21372 Foot drop, left foot: Secondary | ICD-10-CM | POA: Diagnosis not present

## 2016-12-11 DIAGNOSIS — K219 Gastro-esophageal reflux disease without esophagitis: Secondary | ICD-10-CM | POA: Diagnosis not present

## 2016-12-11 DIAGNOSIS — I10 Essential (primary) hypertension: Secondary | ICD-10-CM | POA: Diagnosis not present

## 2016-12-11 DIAGNOSIS — G936 Cerebral edema: Secondary | ICD-10-CM | POA: Diagnosis not present

## 2016-12-11 DIAGNOSIS — D320 Benign neoplasm of cerebral meninges: Secondary | ICD-10-CM | POA: Diagnosis not present

## 2016-12-11 DIAGNOSIS — M19072 Primary osteoarthritis, left ankle and foot: Secondary | ICD-10-CM | POA: Diagnosis not present

## 2016-12-11 DIAGNOSIS — Z79899 Other long term (current) drug therapy: Secondary | ICD-10-CM | POA: Diagnosis not present

## 2016-12-11 DIAGNOSIS — D329 Benign neoplasm of meninges, unspecified: Secondary | ICD-10-CM | POA: Diagnosis not present

## 2016-12-11 DIAGNOSIS — Z01818 Encounter for other preprocedural examination: Secondary | ICD-10-CM | POA: Diagnosis not present

## 2016-12-11 DIAGNOSIS — R5381 Other malaise: Secondary | ICD-10-CM | POA: Diagnosis not present

## 2016-12-13 DIAGNOSIS — D320 Benign neoplasm of cerebral meninges: Secondary | ICD-10-CM | POA: Diagnosis not present

## 2016-12-13 DIAGNOSIS — D329 Benign neoplasm of meninges, unspecified: Secondary | ICD-10-CM | POA: Diagnosis not present

## 2016-12-13 DIAGNOSIS — G936 Cerebral edema: Secondary | ICD-10-CM | POA: Diagnosis not present

## 2016-12-13 DIAGNOSIS — Z01818 Encounter for other preprocedural examination: Secondary | ICD-10-CM | POA: Diagnosis not present

## 2016-12-14 DIAGNOSIS — G936 Cerebral edema: Secondary | ICD-10-CM | POA: Diagnosis not present

## 2016-12-14 DIAGNOSIS — D320 Benign neoplasm of cerebral meninges: Secondary | ICD-10-CM | POA: Diagnosis not present

## 2016-12-14 DIAGNOSIS — D329 Benign neoplasm of meninges, unspecified: Secondary | ICD-10-CM | POA: Diagnosis not present

## 2016-12-15 DIAGNOSIS — R5381 Other malaise: Secondary | ICD-10-CM | POA: Diagnosis not present

## 2016-12-20 ENCOUNTER — Other Ambulatory Visit: Payer: Self-pay | Admitting: Family Medicine

## 2016-12-21 ENCOUNTER — Ambulatory Visit: Payer: BLUE CROSS/BLUE SHIELD | Admitting: Family Medicine

## 2016-12-27 ENCOUNTER — Ambulatory Visit: Payer: BLUE CROSS/BLUE SHIELD | Attending: Neurosurgery | Admitting: Physical Therapy

## 2016-12-27 ENCOUNTER — Encounter: Payer: Self-pay | Admitting: Physical Therapy

## 2016-12-27 DIAGNOSIS — R262 Difficulty in walking, not elsewhere classified: Secondary | ICD-10-CM | POA: Insufficient documentation

## 2016-12-27 NOTE — Therapy (Signed)
[place] MAIN [facility] SERVICES [address] [place], [place], [address] Phone: [phone]   Fax:  [phone]  Physical Therapy Evaluation  Patient Details  Name: [name] MRN: [id number] Date of Birth: 1960/12/27 Referring Provider: Dr. Deetta Perla  Encounter Date: 12/27/2016      PT End of Session - 12/27/16 1251    Visit Number 1   Number of Visits 7   Date for PT Re-Evaluation 02/07/17   Authorization Type [employer], 30 visits calendar year   PT Start Time 1015   PT Stop Time 1115   PT Time Calculation (min) 60 min   Equipment Utilized During Treatment Gait belt   Activity Tolerance Patient tolerated treatment well;No increased pain   Behavior During Therapy WFL for tasks assessed/performed      Past Medical History:  Diagnosis Date  . Unspecified essential hypertension    controlled with medication;     Past Surgical History:  Procedure Laterality Date  . COLONOSCOPY    . TONSILLECTOMY AND ADENOIDECTOMY      There were no vitals filed for this visit.       Subjective Assessment - 12/27/16 1021    Subjective 56 yo Female reports being s/p meningioma removal 2 weeks (12/14/16); She reports doing well, no headaches or pain; She reports having some dizziness/light headedness. Will have more dizziness with fatigue. She describes the dizziness as her head is spinning; She reports having trouble with keeping eyes closed when resting, feeling like her eye lids of twitching; She reports that her activity level has been limited and she is not sure at what point to do more. She reports that prior to tumor removal she would have left foot drop and LUE weakness (slight); She reports that the foot drop has improved. She does not use any assistive devices; Denies any recent falls; She reports occasional left thumb/hand numbness which was present prior to the tumor. She reports that her left hand is less sensitive as the right hand;    Patient  is accompained by: Family member  husband present   Pertinent History personal factors affecting rehab: recent brain surgery, HTN, lives with husband in single story home, good PLOF   How long can you sit comfortably? NA   How long can you stand comfortably? unsure   How long can you walk comfortably? some fatigue after 1200 feet;    Diagnostic tests 11/08/16: MRI: 6 x 5 x 4.8 cm right para falcine meningioma at the right posterior frontal vertex, anterior to precentral gyrus   Patient Stated Goals know how hard to push and what is safe to do; be as good as she can;    Currently in Pain? No/denies            [facility] PT Assessment - 12/27/16 0001      Assessment   Medical Diagnosis Meningioma   Referring Provider Dr. Deetta Perla   Onset Date/Surgical Date 12/14/16   Hand Dominance Right   Next MD Visit about a month   Prior Therapy denies any PT for this condition;      Precautions   Precautions Fall     Restrictions   Weight Bearing Restrictions No     Balance Screen   Has the patient fallen in the past 6 months No   Has the patient had a decrease in activity level because of a fear of falling?  No   Is the patient reluctant to leave their home  because of a fear of falling?  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      Sensation   Light Touch Appears Intact  grossly; impaired at C5/C6 dermatome on LUE;     Proprioception Appears Intact   Additional Comments decreased sensation in C5, C6 dermatome;      Coordination   Gross Motor Movements are Fluid and Coordinated Yes   Fine Motor Movements are Fluid and Coordinated Yes  slight incoordination in LUE hand    Finger Nose Finger Test accurate with eyes open, slight impaired with eyes closed with LUE   Heel Shin Test accurate bilaterally;     Posture/Postural Control   Posture Comments able to maintain an erect posture     AROM   Overall AROM Comments BUE and BLE AROM     Strength   Overall Strength Comments BUE and BLE is [facility]     Palpation   Palpation comment denies any tenderness to palpation;      Transfers   Comments able to transfer sit<>Stand without pushing on chair;      Ambulation/Gait   Gait Comments ambulates with good reciprocal gait pattern, does ambulate slightly slower speed, occasional foot drag     6 Minute Walk- Baseline   BP (mmHg) 127/56   HR (bpm) 68   02 Sat (%RA) 100 %     6 Minute walk- Post Test   BP (mmHg) 145/53   HR (bpm) 78   02 Sat (%RA) 100 %     6 minute walk test results    Aerobic Endurance Distance Walked 1360   Endurance additional comments community ambulator; less than age group norms of 1800 feet;      High Level Balance   High Level Balance Comments feet together: eyes open/closed, secure no sway; tandem stance: mild sway, eyes closed, loss of balance; able to stand SLS: 10 sec each LE;       All cranial nerves are present and intact;  TREATMENT: Educated patient in sensory awareness for LUE with using different textures on LUE hand, hot/cold, etc;  Also instructed patient in how to increase walking safely at home with watching for fatigue.                   PT Education - 12/27/16 1251    Education provided Yes   Education Details HEP, see patient instructions, recommendations, plan of care   Person(s) Educated Patient   Methods Explanation;Verbal cues;Handout    Comprehension Verbalized understanding;Returned demonstration;Verbal cues required             PT Long Term Goals - 12/27/16 1258      PT LONG TERM GOAL #1   Title Patient will be independent in home exercise program to improve strength/mobility for better functional independence with ADLs.   Time 6   Period Weeks   Status New  PT LONG TERM GOAL #2   Title Patient will increase lower extremity functional scale to >60/80 to demonstrate improved functional mobility and increased tolerance with ADLs.    Time 6   Period Weeks   Status New     PT LONG TERM GOAL #3   Title Patient will increase six minute walk test distance to >1500 for progression to community ambulator and improve gait ability against age group norms.   Time 6   Period Weeks   Status New               Plan - 12/27/16 1252    Clinical Impression Statement 56 yo Female s/p recent surgery to remove meningioma on 12/14/16. Patient reports that prior to surgery she experienced left foot drop, and weakness on left side; Patient reports since surgery her overall mobility has improved. She does have dizziness which she reports is intermittent. She reports no foot drop and demonstrates good strength in BUE and BLE. She demonstrates good static and dynamic balance. She is able to maintain SLS for 10 sec each LE. She does have trouble with tandem stance especially with eyes closed. Patient also ambulated at slower speed than age groups norms with 6 min walk test. Patient would benefit from skilled PT intervention to improve balance and gait tasks for return to PLOF.    Rehab Potential Good   Clinical Impairments Affecting Rehab Potential positive: good PLOF, good caregiver support, low fall risk; negative: history of brain tumor, HTN; Patient's clinical presentation is stable as most of her symptoms have resolved and/or improved.    PT Frequency 1x / week   PT Duration 6 weeks   PT Treatment/Interventions ADLs/Self  Care Home Management;Cryotherapy;Gait training;Moist Heat;Stair training;Functional mobility training;Therapeutic activities;Therapeutic exercise;Balance training;Neuromuscular re-education;Manual techniques;Patient/family education;Vestibular;Visual/perceptual remediation/compensation   PT Next Visit Plan advance HEP   PT Home Exercise Plan initiated- educated patient in walking and using different materials for sensory awareness    Consulted and Agree with Plan of Care Patient      Patient will benefit from skilled therapeutic intervention in order to improve the following deficits and impairments:  Dizziness, Impaired sensation, Decreased coordination, Decreased endurance, Decreased activity tolerance, Decreased balance, Difficulty walking  Visit Diagnosis: Difficulty in walking, not elsewhere classified - Plan: PT plan of care cert/re-cert     Problem List Patient Active Problem List   Diagnosis Date Noted  . Neuropathy ([place]) 09/19/2016  . Decreased hearing 12/16/2015  . Need for influenza vaccination 08/10/2015  . Osteoarthritis 06/29/2015  . Chest discomfort 02/14/2013  . Essential hypertension       Trotter,Margaret PT, DPT 12/27/2016, 1:00 PM  [place] MAIN [facility] SERVICES [address] [place], [place], [address] Phone: [phone]   Fax:  [phone]  Name: ZORICA HICKENBOTTOM MRN: [id number] Date of Birth: 1961/07/14

## 2016-12-27 NOTE — Patient Instructions (Signed)
Keep walking- start at 2x a day and if you feel well, try 3 times a day. Walk until you feel moderately fatigued (5-6 on a 0-10 point scale)  If you start feeling the blurred vision, or you notice increase fatigue or annoyance feeling then you need to take a break.  Also try rubbing different materials on your left hand (ex- satin vs corduroy/stiff cotton)  Also you could try alternating your left hand in warm and cold water (try 1-2 min in each and alternate 2-3 times)

## 2016-12-28 DIAGNOSIS — D329 Benign neoplasm of meninges, unspecified: Secondary | ICD-10-CM | POA: Diagnosis not present

## 2017-01-04 ENCOUNTER — Encounter: Payer: Self-pay | Admitting: Physical Therapy

## 2017-01-04 ENCOUNTER — Ambulatory Visit: Payer: BLUE CROSS/BLUE SHIELD | Attending: Neurosurgery | Admitting: Physical Therapy

## 2017-01-04 DIAGNOSIS — R262 Difficulty in walking, not elsewhere classified: Secondary | ICD-10-CM | POA: Diagnosis not present

## 2017-01-04 NOTE — Patient Instructions (Addendum)
  ABDUCTION: Sitting - Exercise Ball: Resistance Band (Active)   Sit with feet flat. With band tied around both legs, Lift right leg slightly and, against resistance band, draw it out to side. Complete __2_ sets of __10_ repetitions. Perform _2__ sessions per day.  Copyright  VHI. All rights reserved.  FLEXION: Sitting - Resistance Band (Active)   Sit, both feet flat. Have band tied around both legs above knees, lift right knee toward ceiling.Repeat with other knee Complete _2__ sets of _10__ repetitions. Perform _2__ sessions per day.  [email or web address].[email or web address]   Knee Extension: Resisted (Sitting)   With band looped around right ankle and under other foot, straighten leg with ankle loop. Keep other leg bent to increase resistance. Repeat _10___ times per set. Do __2__ sets per session. Do _2___ sessions per day.  http://[email or web address].[email or web address]   Copyright  VHI. All rights reserved.  FLEXION: Sitting - Resistance Band (Active)   Sit with right foot flat. Have band tied around both feet, bend ankle, bringing toes toward head. Complete __2_ sets of __10_ repetitions. Perform _2__ sessions per day.  Copyright  VHI. All rights reserved.  HIP / KNEE: Extension - Sit to Stand   Sitting, lean chest forward, raise hips up from surface. Straighten hips and knees. Weight bear equally on left and right sides. Backs of legs should not push off surface. __10_ reps per set, __2_ sets per day, _5__ days per week Use assistive device as needed.  Copyright  VHI. All rights reserved.   Balance, Proprioception: Hip Abduction With Tubing   With tubing attached to both ankles, Standing holding onto counter, kick one leg out to side and then Return.  Repeat _10___ times  On each side.  Do ___2_ sessions per day.  http://cc.[email or web address].us/20    Balance, Proprioception: Hip Extension With Tubing   With tubing tied around both legs, holding onto kitchen counter, swing leg back. Return. Repeat _10___  times . Do __2__ sessions per day.  http://cc.exer.us/19    Copyright  VHI. All rights reserved.  Band Walk: Side Stepping   Tie band around legs, AROUND ANKLES. Step _10__ feet to one side, then step back to start. Repeat _2-3__ feet per session. Note: Small towel between band and skin eases rubbing.  http://plyo.exer.us/76    Copyright  VHI. All rights reserved.  ANKLE: Dorsiflexion, Step Unilateral   Stand on step, hang one heel off back of step. Hold _15__ seconds. __3_ reps per set, __2_ sets per day, _5_ days per week Hold onto a support.  Copyright  VHI. All rights reserved.   Hamstring Stretch (Standing)    Standing, place one foot on 2nd step (hold onto rails for balance as needed), keep knee straight, stand up tall and slowly lean forward until stretch is felt in back of leg. Hold __15__ seconds. Repeat with other leg. Repeat 2-3 times on each side;   Copyright  VHI. All rights reserved.  Lateral Trunk / IT Band Stretch    Stand (No ball needed) other leg crossed in front of ball-side leg.Lean towards counter until stretch is felt in side of hip;  Hold _10-15__ seconds. Do __1_ sets of __2-3_ repetitions.  Copyright  VHI. All rights reserved.

## 2017-01-04 NOTE — Therapy (Signed)
[place] MAIN [facility] SERVICES [address] [place], [place], [address] Phone: [phone]   Fax:  [phone]  Physical Therapy Treatment  Patient Details  Name: [name] MRN: [id number] Date of Birth: [birth year] Referring Provider: Dr. Deetta Perla  Encounter Date: 01/04/2017      PT End of Session - 01/04/17 1320    Visit Number 2   Number of Visits 7   Date for PT Re-Evaluation 02/07/17   Authorization Type BCBS, 30 visits calendar year   PT Start Time 1300   PT Stop Time 1345   PT Time Calculation (min) 45 min   Equipment Utilized During Treatment Gait belt   Activity Tolerance Patient tolerated treatment well;No increased pain   Behavior During Therapy WFL for tasks assessed/performed      Past Medical History:  Diagnosis Date  . Unspecified essential hypertension    controlled with medication;     Past Surgical History:  Procedure Laterality Date  . COLONOSCOPY    . TONSILLECTOMY AND ADENOIDECTOMY      There were no vitals filed for this visit.      Subjective Assessment - 01/04/17 1316    Subjective Patient reports doing well; she reports having increased thumb pain as she has weaned off pain medicine but reports that her thumbs hurt prior to brain surgery; She reports getting her stitches out and states that she has been doing well;    Patient is accompained by: Family member  husband present   Pertinent History personal factors affecting rehab: recent brain surgery, HTN, lives with husband in single story home, good PLOF   How long can you sit comfortably? NA   How long can you stand comfortably? unsure   How long can you walk comfortably? some fatigue after 1200 feet;    Diagnostic tests 11/08/16: MRI: 6 x 5 x 4.8 cm right para falcine meningioma at the right posterior frontal vertex, anterior to precentral gyrus   Patient Stated Goals know how hard to push and what is safe to do; be as good as she can;    Currently in Pain? No/denies        TREATMENT: Warm up on Cross trainer x3 min (unbilled);  Instructed patient in advanced HEP for strengthening: Seated with green tband around both legs: Hip flexion march x10 Hip abduction x10; LAQ x10 Ankle DF x10  All done bilaterally with min VCs to improve ROM for better strengthening;  Standing with green tband around both legs: Hip abduction x10 bilaterally; Hip extension x10 bilaterally; Side stepping x10 feet x2 laps each direction; Patient required min-moderate verbal/tactile cues for correct exercise technique. Patient denies any pain or fatigue with exercise;  Standing: Hamstring stretch on step 15 sec hold x2 bilaterally; Heel off step stretch 15 sec hold x2 bilaterally; IT band stretch 15 sec hold x2 bilaterally;  Patient required mod VCs for positioning to improve stretch;  Resisted walking 12.5# forward/backward, side/side 4 way, x2 laps each direction with cues to slow down eccentric return and improve weight shift for better gait ability;   Sit<>Stand from regular seat x10 without pushing on chair with cues for forward weight shift to improve transfer ability;                          PT Education - 01/04/17 1320    Education provided Yes   Education Details HEP advanced, strengthening, balance;    Person(s) Educated  Patient   Methods Explanation;Verbal cues;Handout   Comprehension Verbalized understanding;Returned demonstration;Verbal cues required             PT Long Term Goals - 12/27/16 1258      PT LONG TERM GOAL #1   Title Patient will be independent in home exercise program to improve strength/mobility for better functional independence with ADLs.   Time 6   Period Weeks   Status New     PT LONG TERM GOAL #2   Title Patient will increase lower extremity functional scale to >60/80 to demonstrate improved functional mobility and increased tolerance with ADLs.    Time 6   Period  Weeks   Status New     PT LONG TERM GOAL #3   Title Patient will increase six minute walk test distance to >1500 for progression to community ambulator and improve gait ability against age group norms.   Time 6   Period Weeks   Status New               Plan - 01/04/17 1358    Clinical Impression Statement Advanced HEP with sitting and standing resisted exercise. Patient required cues for improved positioning for correct exercise technique. She denies any pain or fatigue. Patient also instructed in LE stretches to reduce soreness and discomfort with advanced exercise. She would benefit from additional skilled PT intervention to improve balance, gait safety and strength;    Rehab Potential Good   Clinical Impairments Affecting Rehab Potential positive: good PLOF, good caregiver support, low fall risk; negative: history of brain tumor, HTN; Patient's clinical presentation is stable as most of her symptoms have resolved and/or improved.    PT Frequency 1x / week   PT Duration 6 weeks   PT Treatment/Interventions ADLs/Self Care Home Management;Cryotherapy;Gait training;Moist Heat;Stair training;Functional mobility training;Therapeutic activities;Therapeutic exercise;Balance training;Neuromuscular re-education;Manual techniques;Patient/family education;Vestibular;Visual/perceptual remediation/compensation   PT Next Visit Plan advance HEP   PT Home Exercise Plan advanced, see patient instructions;    Consulted and Agree with Plan of Care Patient      Patient will benefit from skilled therapeutic intervention in order to improve the following deficits and impairments:  Dizziness, Impaired sensation, Decreased coordination, Decreased endurance, Decreased activity tolerance, Decreased balance, Difficulty walking  Visit Diagnosis: Difficulty in walking, not elsewhere classified     Problem List Patient Active Problem List   Diagnosis Date Noted  . Neuropathy ([place]) 09/19/2016  .  Decreased hearing 12/16/2015  . Need for influenza vaccination 08/10/2015  . Osteoarthritis 06/29/2015  . Chest discomfort 02/14/2013  . Essential hypertension     Trotter,Margaret PT, DPT 01/04/2017, 1:59 PM  [place] MAIN [facility] SERVICES [address] [place], [place], [address] Phone: [phone]   Fax:  [phone]  Name: RICHETTA BRIDGETT MRN: [id number] Date of Birth: 1961/03/31

## 2017-01-09 ENCOUNTER — Ambulatory Visit: Payer: BLUE CROSS/BLUE SHIELD | Admitting: Physical Therapy

## 2017-01-11 ENCOUNTER — Encounter: Payer: Self-pay | Admitting: Physical Therapy

## 2017-01-11 ENCOUNTER — Ambulatory Visit: Payer: BLUE CROSS/BLUE SHIELD | Admitting: Physical Therapy

## 2017-01-11 DIAGNOSIS — R262 Difficulty in walking, not elsewhere classified: Secondary | ICD-10-CM

## 2017-01-11 NOTE — Therapy (Signed)
[place] MAIN [facility] SERVICES [address] [place], [place], [address] Phone: [phone]   Fax:  [phone]  Physical Therapy Treatment  Patient Details  Name: [name] MRN: [id number] Date of Birth: [birth year] Referring Provider: Dr. Deetta Perla  Encounter Date: 01/11/2017      PT End of Session - 01/11/17 1032    Visit Number 3   Number of Visits 7   Date for PT Re-Evaluation 02/07/17   Authorization Type BCBS, 30 visits calendar year   PT Start Time 1025   PT Stop Time 1105   PT Time Calculation (min) 40 min   Equipment Utilized During Treatment Gait belt   Activity Tolerance Patient tolerated treatment well;No increased pain   Behavior During Therapy WFL for tasks assessed/performed      Past Medical History:  Diagnosis Date  . Unspecified essential hypertension    controlled with medication;     Past Surgical History:  Procedure Laterality Date  . COLONOSCOPY    . TONSILLECTOMY AND ADENOIDECTOMY      There were no vitals filed for this visit.      Subjective Assessment - 01/11/17 1031    Subjective Patient reports doing well; She reports doing okay with going to her grandson's birthday party with no difficulties; She reports compliance with HEP and reports no new changes; States, "I feel so much better"    Patient is accompained by: Family member  husband present   Pertinent History personal factors affecting rehab: recent brain surgery, HTN, lives with husband in single story home, good PLOF   How long can you sit comfortably? NA   How long can you stand comfortably? unsure   How long can you walk comfortably? some fatigue after 1200 feet;    Diagnostic tests 11/08/16: MRI: 6 x 5 x 4.8 cm right para falcine meningioma at the right posterior frontal vertex, anterior to precentral gyrus   Patient Stated Goals know how hard to push and what is safe to do; be as good as she can;    Currently in Pain? No/denies         TREATMENT: Warm up on treadmill 2.0 mph x4 min with 1-2 HHA with cues to increase step length and erect posture for good gait mechanics;  Instructed patient in advanced balance exercise: SLS on firm surface 10 sec hold x2 each LE with 1 HHA progressing to no HHA; Tandem stance on firm surface 10 sec hold x2 each foot in front with cues to improve trunk control for better static balance;  Educated patient in ways to increase difficulty including to raise arms overhead, initiate head turns all direction, toss ball against wall etc to challenge static standing balance; Patient stood SLS/tandem stance with ball toss unsupported with close supervision to [place] for safety;  Forward/backward walking on even surface, x10 feet x3 laps unsupported; Patient demonstrates good step length; she did require cues to improve erect head for less loss of balance;  Side step with LE SLS, 3 sec hold x10 steps each direction; Patient demonstrates increased difficulty stepping to left side and holding SLS due to weight shiftting;  Marching in place: Ball bounce with cues to improve coordination with improving cadence during bounce x5 catches; Ball toss to side against wall and catching x5 reps;  Forward lunges with arms overhead and looking towards ceiling x10 each LE with cues to improve coordination of UE/LE movement for better fluidity;  Leg press: BLE 90# 2x12 with  cues to slow down LE movement for better strengthening;  HOIST hamstring curl, BLE plate #5 1E07 with cues for positioning and to slow down eccentric return for better strengthening;  Patient tolerated well without difficulty;             PT Education - 01/11/17 1031    Education provided Yes   Education Details HEP advanced, balance, strengthening;    Person(s) Educated Patient   Methods Explanation;Verbal cues;Handout   Comprehension Verbalized understanding;Returned demonstration;Verbal cues required             PT  Long Term Goals - 12/27/16 1258      PT LONG TERM GOAL #1   Title Patient will be independent in home exercise program to improve strength/mobility for better functional independence with ADLs.   Time 6   Period Weeks   Status New     PT LONG TERM GOAL #2   Title Patient will increase lower extremity functional scale to >60/80 to demonstrate improved functional mobility and increased tolerance with ADLs.    Time 6   Period Weeks   Status New     PT LONG TERM GOAL #3   Title Patient will increase six minute walk test distance to >1500 for progression to community ambulator and improve gait ability against age group norms.   Time 6   Period Weeks   Status New               Plan - 01/11/17 1229    Clinical Impression Statement Patient instructed in advanced balance exercise. Patient instructed in static and dynamic balance exerise. She was able to stand SLS and tandem stance for 10 sec without rail assist. Instructed patient in activities such as raising arms overhead, turning head, etc to increase static balance challenge. Patient denies any pain or fatigue after treatment session. She would benefit from additional skilled PT intervention to improve balance/gait safety and reduce fall risk;    Rehab Potential Good   Clinical Impairments Affecting Rehab Potential positive: good PLOF, good caregiver support, low fall risk; negative: history of brain tumor, HTN; Patient's clinical presentation is stable as most of her symptoms have resolved and/or improved.    PT Frequency 1x / week   PT Duration 6 weeks   PT Treatment/Interventions ADLs/Self Care Home Management;Cryotherapy;Gait training;Moist Heat;Stair training;Functional mobility training;Therapeutic activities;Therapeutic exercise;Balance training;Neuromuscular re-education;Manual techniques;Patient/family education;Vestibular;Visual/perceptual remediation/compensation   PT Next Visit Plan advance HEP   PT Home Exercise Plan  advanced, see patient instructions;    Consulted and Agree with Plan of Care Patient      Patient will benefit from skilled therapeutic intervention in order to improve the following deficits and impairments:  Dizziness, Impaired sensation, Decreased coordination, Decreased endurance, Decreased activity tolerance, Decreased balance, Difficulty walking  Visit Diagnosis: Difficulty in walking, not elsewhere classified     Problem List Patient Active Problem List   Diagnosis Date Noted  . Neuropathy ([place]) 09/19/2016  . Decreased hearing 12/16/2015  . Need for influenza vaccination 08/10/2015  . Osteoarthritis 06/29/2015  . Chest discomfort 02/14/2013  . Essential hypertension     Trotter,Margaret PT, DPT 01/11/2017, 12:32 PM  [place] MAIN [facility] SERVICES [address] [place], [place], [address] Phone: [phone]   Fax:  [phone]  Name: KAWENA LYDAY MRN: [id number] Date of Birth: September 13, 1961

## 2017-01-11 NOTE — Patient Instructions (Signed)
  Backward Walking   Walk backward, toes of each foot coming down first. Take long, even strides. Make sure you have a clear pathway with no obstructions when you do this. Stand beside counter and walk backward  And then walk forward doing opposite directions; repeat 10 laps 2x a day at least 5 days a week.  Copyright  VHI. All rights reserved.  Tandem Walking   Stand beside kitchen sink and place one foot in front of the other, lift your hand and try to hold position for 10 sec. Repeat with other foot in front; Repeat 5 reps with each foot in front 5 days a week.Balance: Unilateral   Attempt to balance on left leg, eyes open. Hold _5-10___ seconds.Start with holding onto counter and if you get your balance you can try to let go of counter. Repeat __5__ times per set. Do __1__ sets per session. Do __1__ sessions per day. Keep eyes open:   To increase difficulty: Try adding a head turn up/down, side/side Try passing a ball each hand side/side Try tossing the ball against the wall Try bring both arms over head and reaching towards ceiling and back down;   http://orth.exer.us/29   Copyright  VHI. All rights reserved.   Bound: Lateral   Start beside incline. Step to side and hold Single leg stance for 3 sec.  Repeat _5-10__ times. Repeat on other side for set. Rest _2__ seconds after set. Do _1__ sets per session.  http://plyo.exer.us/93   Copyright  VHI. All rights reserved.  Forward Lunge   Standing with feet shoulder width apart, step forward into lunge with left leg. Hold _2-3___ seconds. Repeat _10___ times per session. Do _1___ sessions per day. Repeat with opposite leg. To increase difficulty as you step forward, bring both arms up overhead and look up and reach towards ceiling, then bring arms down when you step together;   Copyright  VHI. All rights reserved.  Marching in Place: [place] in place, throw ball down to floor. Track ball  movement with head and eyes. Work on trying to keep the same marching speed while bouncing ball;  Repeat __10__catches per session. Do _1___ sessions per day.   Copyright  VHI. All rights reserved.  Marching in Place: Ball Throw Hand to Hand   Stand next to the wall, try marching in place and toss ball against wall to the side while maintaining a good marching speed. Working on coordination Repeat 2 min each direction

## 2017-01-15 ENCOUNTER — Ambulatory Visit: Payer: BLUE CROSS/BLUE SHIELD | Admitting: Physical Therapy

## 2017-01-17 ENCOUNTER — Encounter: Payer: Self-pay | Admitting: Physical Therapy

## 2017-01-17 ENCOUNTER — Ambulatory Visit: Payer: BLUE CROSS/BLUE SHIELD | Admitting: Physical Therapy

## 2017-01-17 VITALS — BP 150/108 | HR 75

## 2017-01-17 DIAGNOSIS — R262 Difficulty in walking, not elsewhere classified: Secondary | ICD-10-CM | POA: Diagnosis not present

## 2017-01-17 NOTE — Therapy (Signed)
[place] MAIN [facility] SERVICES [address] [place], [place], [address] Phone: [phone]   Fax:  [phone]  Physical Therapy Treatment  Patient Details  Name: [name] MRN: [id number] Date of Birth: 11/22/1960 Referring Provider: Dr. Deetta Perla  Encounter Date: 01/17/2017      PT End of Session - 01/17/17 0852    Visit Number 4   Number of Visits 7   Date for PT Re-Evaluation 02/07/17   Authorization Type BCBS, 30 visits calendar year   PT Start Time 0850   PT Stop Time 0930   PT Time Calculation (min) 40 min   Equipment Utilized During Treatment Gait belt   Activity Tolerance Patient tolerated treatment well;No increased pain   Behavior During Therapy WFL for tasks assessed/performed      Past Medical History:  Diagnosis Date  . Unspecified essential hypertension    controlled with medication;     Past Surgical History:  Procedure Laterality Date  . COLONOSCOPY    . TONSILLECTOMY AND ADENOIDECTOMY      Vitals:   01/17/17 0856  BP: (!) 150/108  Pulse: 75  SpO2: 99%        Subjective Assessment - 01/17/17 0855    Subjective Pt reports the progression of her exercises have felt right.  She has been completing her HEP without questions or concerns.  Initial BP 150/108, pt reports she normally takes her BP medication in the morning but did not take them this morning before her session as one of the medications she takes causes her to need to void frequently.  BP retaken after a few minutes of resting 126/70.   Patient is accompained by: Family member  husband present   Pertinent History personal factors affecting rehab: recent brain surgery, HTN, lives with husband in single story home, good PLOF   How long can you sit comfortably? NA   How long can you stand comfortably? unsure   How long can you walk comfortably? some fatigue after 1200 feet;    Diagnostic tests 11/08/16: MRI: 6 x 5 x 4.8 cm right para falcine  meningioma at the right posterior frontal vertex, anterior to precentral gyrus   Patient Stated Goals know how hard to push and what is safe to do; be as good as she can;    Currently in Pain? No/denies       TREATMENT   Therapeutic Exercise:  Forward lunges onto Bosu ball x10 each LE requiring up to 1UE support on // bars  Sit<>stand with RTB around knees and powering up to standing reaching overhead with yellow medicine ball (2.2#) 2x10. Cues to look up at ball when reaching overhead.  Hip F against RTB in standing 2x20 with occasional fingertip support to steady  Hip raises on 6" step while holding onto railing x10 each LE   Neuromuscular Re-education:  Stepping up and over Bosu ball x10 each direction with occasional fingertip support to steady  Tandem stance on airex rotating trunk and handing weightless ball to R and then to L 2x15 each side                 PT Education - 01/17/17 0852    Education provided Yes   Education Details Exercise Technique   Person(s) Educated Patient   Methods Explanation;Demonstration;Verbal cues   Comprehension Verbalized understanding;Returned demonstration;Need further instruction             PT Long Term Goals - 12/27/16 1258  PT LONG TERM GOAL #1   Title Patient will be independent in home exercise program to improve strength/mobility for better functional independence with ADLs.   Time 6   Period Weeks   Status New     PT LONG TERM GOAL #2   Title Patient will increase lower extremity functional scale to >60/80 to demonstrate improved functional mobility and increased tolerance with ADLs.    Time 6   Period Weeks   Status New     PT LONG TERM GOAL #3   Title Patient will increase six minute walk test distance to >1500 for progression to community ambulator and improve gait ability against age group norms.   Time 6   Period Weeks   Status New               Plan - 01/17/17 0930    Clinical Impression  Statement Pt demonstrates unsteadiness with higher level balance exercises but tolerated all challenges to dynamic balance well today.  She reports hesitancy with endurance activities at home and will benefit from continued strengthening and BLE endurance exercises.  Pt will benefit from continued skilled PT interventions for improved QOL.   Rehab Potential Good   Clinical Impairments Affecting Rehab Potential positive: good PLOF, good caregiver support, low fall risk; negative: history of brain tumor, HTN; Patient's clinical presentation is stable as most of her symptoms have resolved and/or improved.    PT Frequency 1x / week   PT Duration 6 weeks   PT Treatment/Interventions ADLs/Self Care Home Management;Cryotherapy;Gait training;Moist Heat;Stair training;Functional mobility training;Therapeutic activities;Therapeutic exercise;Balance training;Neuromuscular re-education;Manual techniques;Patient/family education;Vestibular;Visual/perceptual remediation/compensation   PT Next Visit Plan advance HEP   PT Home Exercise Plan advanced, see patient instructions;    Consulted and Agree with Plan of Care Patient      Patient will benefit from skilled therapeutic intervention in order to improve the following deficits and impairments:  Dizziness, Impaired sensation, Decreased coordination, Decreased endurance, Decreased activity tolerance, Decreased balance, Difficulty walking  Visit Diagnosis: Difficulty in walking, not elsewhere classified     Problem List Patient Active Problem List   Diagnosis Date Noted  . Neuropathy ([place]) 09/19/2016  . Decreased hearing 12/16/2015  . Need for influenza vaccination 08/10/2015  . Osteoarthritis 06/29/2015  . Chest discomfort 02/14/2013  . Essential hypertension     Collie Siad PT, DPT 01/17/2017, 9:32 AM  [place] MAIN [facility] SERVICES [address] [place], [place], [address] Phone: [phone]   Fax:   [phone]  Name: KELSEIGH DIVER MRN: [id number] Date of Birth: 19-Jan-1961

## 2017-01-19 ENCOUNTER — Other Ambulatory Visit: Payer: Self-pay | Admitting: Neurosurgery

## 2017-01-19 DIAGNOSIS — D329 Benign neoplasm of meninges, unspecified: Secondary | ICD-10-CM

## 2017-01-23 ENCOUNTER — Ambulatory Visit: Payer: BLUE CROSS/BLUE SHIELD | Admitting: Physical Therapy

## 2017-01-25 ENCOUNTER — Ambulatory Visit: Payer: BLUE CROSS/BLUE SHIELD | Admitting: Physical Therapy

## 2017-01-25 ENCOUNTER — Encounter: Payer: Self-pay | Admitting: Physical Therapy

## 2017-01-25 DIAGNOSIS — R262 Difficulty in walking, not elsewhere classified: Secondary | ICD-10-CM

## 2017-01-25 NOTE — Therapy (Signed)
[place] MAIN [facility] SERVICES [address] [place], [place], [address] Phone: [phone]   Fax:  [phone]  Physical Therapy Treatment/Progress Note  Patient Details  Name: [name] MRN: [id number] Date of Birth: [birth year] Referring Provider: Dr. Deetta Perla  Encounter Date: 01/25/2017      PT End of Session - 01/25/17 0858    Visit Number 5   Number of Visits 7   Date for PT Re-Evaluation 02/07/17   Authorization Type BCBS, 30 visits calendar year   PT Start Time 0850   PT Stop Time 0930   PT Time Calculation (min) 40 min   Equipment Utilized During Treatment Gait belt   Activity Tolerance Patient tolerated treatment well;No increased pain   Behavior During Therapy WFL for tasks assessed/performed      Past Medical History:  Diagnosis Date  . Unspecified essential hypertension    controlled with medication;     Past Surgical History:  Procedure Laterality Date  . COLONOSCOPY    . TONSILLECTOMY AND ADENOIDECTOMY      There were no vitals filed for this visit.      Subjective Assessment - 01/25/17 0857    Subjective Patient reports doing well; She reports compliance with HEP; Patient states, "I feel like I am getting better each time. I was feeling a little sore but that is good."    Patient is accompained by: Family member  husband present   Pertinent History personal factors affecting rehab: recent brain surgery, HTN, lives with husband in single story home, good PLOF   How long can you sit comfortably? NA   How long can you stand comfortably? unsure   How long can you walk comfortably? some fatigue after 1200 feet;    Diagnostic tests 11/08/16: MRI: 6 x 5 x 4.8 cm right para falcine meningioma at the right posterior frontal vertex, anterior to precentral gyrus   Patient Stated Goals know how hard to push and what is safe to do; be as good as she can;    Currently in Pain? No/denies            [facility] PT  Assessment - 01/25/17 0001      Observation/Other Assessments   Lower Extremity Functional Scale  59/80 (The lower the score the greater the disability)     6 Minute walk- Post Test   BP (mmHg) 145/77   HR (bpm) 87   02 Sat (%RA) 100 %     6 minute walk test results    Aerobic Endurance Distance Walked 1740   Endurance additional comments community ambulator; improved from 12/27/16 which was 1360; closer to age group norms of 1880     Standardized Balance Assessment   Five times sit to stand comments  11.5 sec without HHA (>10 sec indicates increased risk for falls)    TREATMENT: Warm up on Nustep BUE/BLE level 2 x4 min (unbilled);  Instructed patient in 6 min walk, LEFs, and other outcome measures to assess progress towards goals, see above;   Patient demonstrates significant improvement in functional mobility and gait ability;  Resisted walking: 17.5# forward/backward, side/side, 4 way x2 laps each with supervision and cues to improve weight shift especially when walking eccentrically for better safety and gait control;  Patient denies any discomfort after treatment session; Recommend patient continue with HEP as previously given;  PT Education - 01/25/17 0858    Education provided Yes   Education Details HEP reinforced, strengthening, progress towards goals;    Person(s) Educated Patient   Methods Explanation;Verbal cues   Comprehension Verbalized understanding;Returned demonstration;Verbal cues required             PT Long Term Goals - 01/25/17 0858      PT LONG TERM GOAL #1   Title Patient will be independent in home exercise program to improve strength/mobility for better functional independence with ADLs.   Time 6   Period Weeks   Status On-going     PT LONG TERM GOAL #2   Title Patient will increase lower extremity functional scale to >60/80 to demonstrate improved functional mobility and increased tolerance  with ADLs.    Time 6   Period Weeks   Status Partially Met     PT LONG TERM GOAL #3   Title Patient will increase six minute walk test distance to >1500 for progression to community ambulator and improve gait ability against age group norms.   Time 6   Period Weeks   Status Achieved               Plan - 01/25/17 1114    Clinical Impression Statement Patient demonstrates significant improvement in functional ability and gait endurance; she has made progress towards all goals and met some. Instructed patient in resisted walking to facilitate increased strengthening with gait tasks; She reports being able to do most ADLs at home. 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 Patient agreeable. She reports compliance with HEP; She would benefit from an additional skilled PT intervention to advance HEP for better functional mobility and fitness goals.    Rehab Potential Good   Clinical Impairments Affecting Rehab Potential positive: good PLOF, good caregiver support, low fall risk; negative: history of brain tumor, HTN; Patient's clinical presentation is stable as most of her symptoms have resolved and/or improved.    PT Frequency 1x / week   PT Duration 6 weeks   PT Treatment/Interventions ADLs/Self Care Home Management;Cryotherapy;Gait training;Moist Heat;Stair training;Functional mobility training;Therapeutic activities;Therapeutic exercise;Balance training;Neuromuscular re-education;Manual techniques;Patient/family education;Vestibular;Visual/perceptual remediation/compensation   PT Next Visit Plan advance HEP   PT Home Exercise Plan continue as given;    Consulted and Agree with Plan of Care Patient      Patient will benefit from skilled therapeutic intervention in order to  improve the following deficits and impairments:  Dizziness, Impaired sensation, Decreased coordination, Decreased endurance, Decreased activity tolerance, Decreased balance, Difficulty walking  Visit Diagnosis: Difficulty in walking, not elsewhere classified     Problem List Patient Active Problem List   Diagnosis Date Noted  . Neuropathy ([place]) 09/19/2016  . Decreased hearing 12/16/2015  . Need for influenza vaccination 08/10/2015  . Osteoarthritis 06/29/2015  . Chest discomfort 02/14/2013  . Essential hypertension     Alexiya Franqui PT, DPT 01/25/2017, 11:16 AM  [place] MAIN [facility] SERVICES [address] [place], [place], [address] Phone: [phone]   Fax:  [phone]  Name: [name] MRN: [id number] Date of Birth: [birth year]

## 2017-01-26 ENCOUNTER — Ambulatory Visit
Admission: RE | Admit: 2017-01-26 | Discharge: 2017-01-26 | Disposition: A | Discharge status: Home / Self Care | Payer: BLUE CROSS/BLUE SHIELD | Source: Ambulatory Visit | Attending: Neurosurgery | Admitting: Neurosurgery

## 2017-01-26 DIAGNOSIS — R930 Abnormal findings on diagnostic imaging of skull and head, not elsewhere classified: Secondary | ICD-10-CM | POA: Diagnosis not present

## 2017-01-26 DIAGNOSIS — D329 Benign neoplasm of meninges, unspecified: Secondary | ICD-10-CM | POA: Insufficient documentation

## 2017-01-26 MED ORDER — GADOBENATE DIMEGLUMINE 529 MG/ML IV SOLN
16.0000 mL | Freq: Once | INTRAVENOUS | Status: AC | PRN
Start: 2017-01-26 — End: 2017-01-26
  Administered 2017-01-26: 16 mL via INTRAVENOUS

## 2017-01-30 ENCOUNTER — Ambulatory Visit: Payer: BLUE CROSS/BLUE SHIELD | Admitting: Physical Therapy

## 2017-02-01 ENCOUNTER — Other Ambulatory Visit: Payer: Self-pay | Admitting: Neurosurgery

## 2017-02-01 ENCOUNTER — Encounter: Payer: Self-pay | Admitting: Physical Therapy

## 2017-02-01 ENCOUNTER — Ambulatory Visit: Payer: BLUE CROSS/BLUE SHIELD | Admitting: Physical Therapy

## 2017-02-01 DIAGNOSIS — R262 Difficulty in walking, not elsewhere classified: Secondary | ICD-10-CM | POA: Diagnosis not present

## 2017-02-01 DIAGNOSIS — D329 Benign neoplasm of meninges, unspecified: Secondary | ICD-10-CM

## 2017-02-01 NOTE — Patient Instructions (Addendum)
Band Walk: Zig Zag   Tie green band around legs, just above knees. Walk forward _both__ feet in a zig zag pattern. Without turning walk backward to start for one zig zag. Repeat _2-3__ zig zags per session.   http://plyo.exer.us/80   Copyright  VHI. All rights reserved.    Heel Raise: Unilateral (Standing)    Balance on left foot, then rise on ball of foot.Hold onto counter as needed for balance;  Repeat _10___ times per set. Do __2__ sets per session. Do __2__ sessions per day.  http://orth.exer.us/41   Copyright  VHI. All rights reserved.  Grapevine Walk    Cross one leg in front, bring back leg out to side, then cross first leg behind the other and front leg out to side. Session: do about 10-15 feet each direction, 2 times;  Arm movement:to increase difficulty, add a ball toss, or arms overhead;   Copyright  VHI. All rights reserved.  Walking on Heels    Walk on heels for __10-15_ feet while continuing on a straight path. Do 2___ sessions per day.  Copyright  VHI. All rights reserved.  Toe Walk    Raising heels off pool bottom, walk on toes and balls of feet. Session: Walk _10-15__ feet. Do __2_ sessions per week. Arm movement: Swing, elbows straight (UEP-1) Swing, elbows bent (UEP-2) Breaststroke (UEP-3) Move: Forward   Copyright  VHI. All rights reserved.

## 2017-02-01 NOTE — Therapy (Signed)
[place] MAIN [facility] SERVICES [address] [place], [place], [address] Phone: [phone]   Fax:  [phone]  Physical Therapy Treatment/Discharge Summary  Patient Details  Name: [name] MRN: [id number] Date of Birth: 06-05-1961 Referring Provider: Dr. Deetta Perla  Encounter Date: 02/01/2017      PT End of Session - 02/01/17 1316    Visit Number 6   Number of Visits 7   Date for PT Re-Evaluation 02/07/17   Authorization Type BCBS, 30 visits calendar year   PT Start Time 1302   PT Stop Time 1345   PT Time Calculation (min) 43 min   Activity Tolerance Patient tolerated treatment well;No increased pain   Behavior During Therapy WFL for tasks assessed/performed      Past Medical History:  Diagnosis Date  . Unspecified essential hypertension    controlled with medication;     Past Surgical History:  Procedure Laterality Date  . COLONOSCOPY    . TONSILLECTOMY AND ADENOIDECTOMY      There were no vitals filed for this visit.      Subjective Assessment - 02/01/17 1314    Subjective Patient reports doing well; She reports compliance with HEP; She reports over doing it a little last week; She reports, "I hope that I can continue exercising at home."    Patient is accompained by: Family member  husband present   Pertinent History personal factors affecting rehab: recent brain surgery, HTN, lives with husband in single story home, good PLOF   How long can you sit comfortably? NA   How long can you stand comfortably? unsure   How long can you walk comfortably? some fatigue after 1200 feet;    Diagnostic tests 11/08/16: MRI: 6 x 5 x 4.8 cm right para falcine meningioma at the right posterior frontal vertex, anterior to precentral gyrus   Patient Stated Goals know how hard to push and what is safe to do; be as good as she can;    Currently in Pain? No/denies         TREATMENT: Gait on treadmill 1.5-1.8 mph with 2 HHA x6 min  during history intake and discussion about exercise program;  Instructed patient in advanced exercise: Heel walk, toe walk x15 feet unsupported with cues to increase ROM for better ankle strengthening; Diagonal walking green tband around ankles x15 feet forward/backward with min Vcs for step progressing and sequencing; Single leg heel raise x10 bilaterally; Patient encouraged to use rail assist as needed for balance;  Grapevine crossovers x15 feet x2 laps each direction with and without ball toss to improve coordination and balance;  Provided written handout for improved compliance; Provided blue tband to increase strengthening with standing tband exercise;  Patient on upright recumbent bike, level 5, x6 min with cues on how to advance cardiovascular endurance;   Patient is independent in all self care ADLs; She has been back to work and is able to work from 8-3PM without much difficulty;                      PT Education - 02/01/17 1315    Education provided Yes   Education Details HEP advanced, strengthening, progress towards goals;    Person(s) Educated Patient   Methods Explanation;Verbal cues   Comprehension Verbalized understanding;Returned demonstration;Verbal cues required             PT Long Term Goals - 02/01/17 1411      PT LONG TERM  GOAL #1   Title Patient will be independent in home exercise program to improve strength/mobility for better functional independence with ADLs.   Time 6   Period Weeks   Status Achieved     PT LONG TERM GOAL #2   Title Patient will increase lower extremity functional scale to >60/80 to demonstrate improved functional mobility and increased tolerance with ADLs.    Time 6   Period Weeks   Status Partially Met     PT LONG TERM GOAL #3   Title Patient will increase six minute walk test distance to >1500 for progression to community ambulator and improve gait ability against age group norms.   Time 6   Period Weeks    Status Achieved               Plan - 02/01/17 1410    Clinical Impression Statement Patient has made significant progress towards goals. She expressed desire to continue exercising at home. Advanced HEP with strengthening and advanced balance exercise. She required min Vcs for correct exercise technique; Also instructed patient in how to improve cardiovascular endurance with upright bike/walking outside. Patient is independent and compliant with HEP; She is appropriate for discharge at this time.    Rehab Potential Good   Clinical Impairments Affecting Rehab Potential positive: good PLOF, good caregiver support, low fall risk; negative: history of brain tumor, HTN; Patient's clinical presentation is stable as most of her symptoms have resolved and/or improved.    PT Frequency 1x / week   PT Duration 6 weeks   PT Treatment/Interventions ADLs/Self Care Home Management;Cryotherapy;Gait training;Moist Heat;Stair training;Functional mobility training;Therapeutic activities;Therapeutic exercise;Balance training;Neuromuscular re-education;Manual techniques;Patient/family education;Vestibular;Visual/perceptual remediation/compensation   PT Next Visit Plan advance HEP   PT Home Exercise Plan advanced- see patient instructions;    Consulted and Agree with Plan of Care Patient      Patient will benefit from skilled therapeutic intervention in order to improve the following deficits and impairments:  Dizziness, Impaired sensation, Decreased coordination, Decreased endurance, Decreased activity tolerance, Decreased balance, Difficulty walking  Visit Diagnosis: Difficulty in walking, not elsewhere classified     Problem List Patient Active Problem List   Diagnosis Date Noted  . Neuropathy ([place]) 09/19/2016  . Decreased hearing 12/16/2015  . Need for influenza vaccination 08/10/2015  . Osteoarthritis 06/29/2015  . Chest discomfort 02/14/2013  . Essential hypertension      Wilson Dusenbery PT, DPT 02/01/2017, 2:11 PM  [place] MAIN [facility] SERVICES [address] [place], [place], [address] Phone: [phone]   Fax:  [phone]  Name: [name] MRN: [id number] Date of Birth: Oct 31, 1961

## 2017-03-05 ENCOUNTER — Other Ambulatory Visit: Payer: Self-pay

## 2017-03-05 DIAGNOSIS — I10 Essential (primary) hypertension: Secondary | ICD-10-CM

## 2017-03-05 MED ORDER — AMLODIPINE BESYLATE 2.5 MG PO TABS: 2.5000 mg | ORAL_TABLET | Freq: Every day | ORAL | 0 refills | Status: DC

## 2017-03-05 NOTE — Telephone Encounter (Signed)
Pharmacy requesting refill Last ov 09/19/16 Last filled 03/02/16 Please review. Thank you. sd

## 2017-03-14 ENCOUNTER — Other Ambulatory Visit: Payer: Self-pay | Admitting: Neurosurgery

## 2017-03-14 DIAGNOSIS — D329 Benign neoplasm of meninges, unspecified: Secondary | ICD-10-CM

## 2017-03-20 ENCOUNTER — Ambulatory Visit
Admission: RE | Admit: 2017-03-20 | Discharge: 2017-03-20 | Disposition: A | Discharge status: Home / Self Care | Payer: BLUE CROSS/BLUE SHIELD | Source: Ambulatory Visit | Attending: Neurosurgery | Admitting: Neurosurgery

## 2017-03-20 DIAGNOSIS — I6200 Nontraumatic subdural hemorrhage, unspecified: Secondary | ICD-10-CM | POA: Diagnosis not present

## 2017-03-20 DIAGNOSIS — D329 Benign neoplasm of meninges, unspecified: Secondary | ICD-10-CM | POA: Insufficient documentation

## 2017-03-20 LAB — POCT I-STAT CREATININE: CREATININE: 0.7 mg/dL (ref 0.44–1.00)

## 2017-03-20 MED ORDER — GADOBENATE DIMEGLUMINE 529 MG/ML IV SOLN
15.0000 mL | Freq: Once | INTRAVENOUS | Status: AC | PRN
  Administered 2017-03-20: 15 mL via INTRAVENOUS

## 2017-04-16 ENCOUNTER — Ambulatory Visit: Payer: BLUE CROSS/BLUE SHIELD

## 2017-05-28 ENCOUNTER — Ambulatory Visit (INDEPENDENT_AMBULATORY_CARE_PROVIDER_SITE_OTHER): Payer: BLUE CROSS/BLUE SHIELD | Admitting: Family Medicine

## 2017-05-28 ENCOUNTER — Encounter: Payer: Self-pay | Admitting: Family Medicine

## 2017-05-28 VITALS — BP 129/57 | HR 60 | Temp 98.3°F | Resp 16 | Ht 63.0 in | Wt 182.4 lb

## 2017-05-28 DIAGNOSIS — E785 Hyperlipidemia, unspecified: Secondary | ICD-10-CM | POA: Insufficient documentation

## 2017-05-28 DIAGNOSIS — R7309 Other abnormal glucose: Secondary | ICD-10-CM | POA: Diagnosis not present

## 2017-05-28 DIAGNOSIS — Z9889 Other specified postprocedural states: Secondary | ICD-10-CM

## 2017-05-28 DIAGNOSIS — E782 Mixed hyperlipidemia: Secondary | ICD-10-CM | POA: Diagnosis not present

## 2017-05-28 DIAGNOSIS — Z86018 Personal history of other benign neoplasm: Secondary | ICD-10-CM | POA: Diagnosis not present

## 2017-05-28 DIAGNOSIS — Z1231 Encounter for screening mammogram for malignant neoplasm of breast: Secondary | ICD-10-CM

## 2017-05-28 DIAGNOSIS — I10 Essential (primary) hypertension: Secondary | ICD-10-CM | POA: Diagnosis not present

## 2017-05-28 DIAGNOSIS — Z1239 Encounter for other screening for malignant neoplasm of breast: Secondary | ICD-10-CM | POA: Insufficient documentation

## 2017-05-28 DIAGNOSIS — Z1211 Encounter for screening for malignant neoplasm of colon: Secondary | ICD-10-CM | POA: Diagnosis not present

## 2017-05-28 NOTE — Progress Notes (Signed)
Subjective:    Patient ID: [name], female    DOB: [birth year], 56 y.o.   MRN: [id number]  [name] is a 56 y.o. female presenting on 05/28/2017 for Hypertension   HPI   Left Frontal Meningioma s/p surgical excision: - Followed by Dr Deetta Perla [facility], [place]), last office visit 03/13/17 (2.5 months post-op since meningioma resection), has had problem with some delayed post-op wound healing, has had interval MRI since then on 5/15, and nurse visit for post-op wound eval. - Interval update: MRI on 5/15 showed near complete resolution of some prior fluid collections, and no evidence of superficial infection. Also stable surveillance with unchanged residual small tumor in superior sagittal sinus  - Today provides updates on background of this problem, since initial dx of foot drop from prior PCP and advanced to imaging to identify problem through Neurology - No new concerns except some poor healing and still concern for potential recent superficial wound infection, however now seems resolved on last antibiotics course with Keflex x 10 days, just finished. She has wound check apt with Dr Lacinda Axon Neurosurgery tomorrow, possibly considering Dermatology, also considered I&D drainage, will re-evaluate this. - Denies any worsening headache, focal neuro symptoms, vision changes   CHRONIC HTN: Reports checks BP at home occasionally, 120s/60s. Previously had history of higher BP in past when her mother had stroke and dementia, had life stress at home, was advised by GYN, several years ago within about 3-5 years new start anti-HTN and dx HTN Current Meds - Amlodipine 2.5mg  (due for refills), HCTZ 25mg , Losartan 100mg  Reports good compliance, took meds today. Tolerating well, w/o complaints. Lifestyle: - Diet: improved grilled chicken and vegetables, trying lower salt diet, now stopped caffeine, drinks mostly water, decaf coffee - Exercise: improved regular exercise, 5x weekly on  exercise bike stationary, now up to 3 miles, had a lot of walking / balance exercises from PT post-op before - Has had some intermittent foot swelling on amlodipine - Previously had more life stress due to mother with alzheimer's who has since passed, and her prior medical problems with brain tumor surgery, now patient is back to work and improved stress Denies CP, dyspnea, HA, edema, dizziness / lightheadedness  Health Maintenance: - Due for colonoscopy, initial at age 34, then repeat in 5 years, last colonoscopy 11/2006, found some polyps that were benign. Followed by Dr Rolly Salter, thinking about cologuard but leaning to colonoscopy, her father had colon cancer age 4 - Due for mammogram, last 06/21/2017, normal, has had no problems before, request order today, she normally goes to GYN for annual physical - Due for pap smear, she will follow-up with GYN, [facility] - but her doctor likely has retired now, she will contact. Last pap smear 06/2015 reported normal. Has prior polyp >20 years ago was normal, had a cone biopsy.  Depression screen [facility] 2/9 03/02/2016 12/16/2015 06/29/2015  Decreased Interest 0 0 0  Down, Depressed, Hopeless 0 0 0  PHQ - 2 Score 0 0 0    Social History  Substance Use Topics  . Smoking status: Never Smoker  . Smokeless tobacco: Never Used  . Alcohol use No    Review of Systems Per HPI unless specifically indicated above     Objective:    BP (!) 129/57   Pulse 60   Temp 98.3 F (36.8 C) (Oral)   Resp 16   Ht 5\' 3"  (1.6 m)   Wt 182 lb 6.4 oz (82.7 kg)  BMI 32.31 kg/m   Wt Readings from Last 3 Encounters:  05/28/17 182 lb 6.4 oz (82.7 kg)  09/19/16 177 lb (80.3 kg)  05/18/16 173 lb (78.5 kg)    Physical Exam  Constitutional: She is oriented to person, place, and time. She appears well-developed and well-nourished. No distress.  Well-appearing, comfortable, cooperative, pleasant  HENT:  Head: Normocephalic.  Mouth/Throat: Oropharynx is clear and  moist.  Top of scalp to left of midline mid to posterior with linear incisional scar appears to be healing well with some pink granulation tissue and some scabbing / crusting without evidence of open ulceration, no drainage, no fluctuance, no induration. Minimal tender. No erythema  Eyes: Conjunctivae are normal. Right eye exhibits no discharge. Left eye exhibits no discharge.  Neck: Normal range of motion. Neck supple. No thyromegaly present.  Cardiovascular: Normal rate, regular rhythm, normal heart sounds and intact distal pulses.   No murmur heard. Pulmonary/Chest: Effort normal and breath sounds normal. No respiratory distress. She has no wheezes. She has no rales.  Musculoskeletal: Normal range of motion. She exhibits no edema.  Lymphadenopathy:    She has no cervical adenopathy.  Neurological: She is alert and oriented to person, place, and time.  Skin: Skin is warm and dry. No rash noted. She is not diaphoretic. No erythema.  Psychiatric: She has a normal mood and affect. Her behavior is normal.  Well groomed, good eye contact, normal speech and thoughts  Nursing note and vitals reviewed.    Lipid Panel     Component Value Date/Time   CHOL 202 (H) 05/18/2016 0846   TRIG 43 05/18/2016 0846   HDL 62 05/18/2016 0846   CHOLHDL 3.3 05/18/2016 0846   VLDL 9 05/18/2016 0846   LDLCALC 131 (H) 05/18/2016 0846     Chemistry      Component Value Date/Time   NA 142 05/18/2016 0846   K 3.9 05/18/2016 0846   CL 105 05/18/2016 0846   CO2 27 05/18/2016 0846   BUN 12 05/18/2016 0846   CREATININE 0.70 03/20/2017 1155   CREATININE 0.59 05/18/2016 0846      Component Value Date/Time   CALCIUM 8.9 05/18/2016 0846   ALKPHOS 40 05/18/2016 0846   AST 17 05/18/2016 0846   ALT 13 05/18/2016 0846   BILITOT 0.4 05/18/2016 0846       Results for orders placed or performed during the hospital encounter of 03/20/17  I-STAT creatinine  Result Value Ref Range   Creatinine, Ser 0.70 0.44 -  1.00 mg/dL      Assessment & Plan:   Problem List Items Addressed This Visit    Screening for colon cancer    Asymptomatic, nearly due for next colonoscopy, patient to contact [facility] GI Dr Vira Agar to schedule. Last 2008.      Screening for breast cancer    Due for routine mammogram for screening Order placed, patient to schedule Follow-up result and with GYN as scheduled      Relevant Orders   MM DIGITAL SCREENING BILATERAL   S/P resection of meningioma    Stable without significant complication, except has mild delayed healing and prior concern for superficial infection, now improved. No evidence on MRI - Also complication with mild residual tumor in superior sagittal sinus unable to be removed at initial surgery due to proximity to blood vessel - Followed by [facility] Neurosurgery Dr Deetta Perla  Plan: 1. Continue to f/u with Neurosurgery as planned with imaging surveillance and incisional post op wound  care 2. Reassurance today no clear evidence of infection      Hyperlipidemia    Future fasting lipid ordered Follow-up result, calculate ASCVD risk and consider statin therapy vs ASA      Relevant Orders   Lipid panel   Essential hypertension - Primary    Well-controlled HTN - Home BP readings normal  No known complications    Plan:  1. Discontinue Amlodipine 2.5mg  daily - since well controlled, and improve lifestyle now 2. Continue HCTZ 25mg  and Losartan 100mg  daily - consider titrate down thiazide in future 3. Encourage improved lifestyle - low sodium diet, regular exercise 4. Continue monitor BP outside office, bring readings to next visit, if persistently >140/90 or new symptoms notify office sooner 5. Follow-up 3 months HTN       Other Visit Diagnoses    Abnormal glucose       Relevant Orders   Hemoglobin A1c        Follow up plan: Return in about 3 months (around 08/28/2017) for blood pressure.  Future labs ordered for 3 weeks, and she is scheduled to see her  GYN for Annual Physical  Nobie Putnam, [facility] 05/29/2017, 1:41 AM

## 2017-05-28 NOTE — Patient Instructions (Addendum)
Thank you for coming to the clinic today.  1. STOP Amlodipine 2.5mg  daily  Continue HCTZ, and Losartan, can try to take both in morning.  Keep working on lifestyle as discussed.  Call Dr Vira Agar at [place] - to find out about next colonoscopy screening, notify me if have questions on cologuard instead.  For Mammogram screening for breast cancer   Call the [place] below anytime to schedule your own appointment now that order has been placed.  [facility] [address] [place], [place] [address] Phone: [phone]  ------------------Call GYN to schedule new patient apt and discuss pap smear etc [facility]   Address: [address] [place], [place], [place],  [address] Hours: 8AM-5PM Phone: [phone]  You will be due for FASTING BLOOD WORK (no food or drink after midnight before, only water or coffee without cream/sugar on the morning of)  - Please go ahead and schedule a "Lab Only" visit in the morning at the clinic for lab draw in 3 weeks  - Make sure Lab Only appointment is at least 1-2 weeks before your next appointment, so that results will be available  For Lab Results, once available within 2-3 days of blood draw, you can can log in to MyChart online to view your results and a brief explanation. Also, we can discuss results at next follow-up visit.  Please schedule a Follow-up Appointment to: Return in about 3 months (around 08/28/2017) for blood pressure.  If you have any other questions or concerns, please feel free to call the clinic or send a message through [place]. You may also schedule an earlier appointment if necessary.  Additionally, you may be receiving a survey about your experience at our clinic within a few days to 1 week by e-mail or mail. We value your feedback.  Nobie Putnam, DO [facility]

## 2017-05-29 DIAGNOSIS — Z9889 Other specified postprocedural states: Secondary | ICD-10-CM

## 2017-05-29 DIAGNOSIS — Z1211 Encounter for screening for malignant neoplasm of colon: Secondary | ICD-10-CM | POA: Insufficient documentation

## 2017-05-29 DIAGNOSIS — Z86018 Personal history of other benign neoplasm: Secondary | ICD-10-CM | POA: Insufficient documentation

## 2017-05-29 NOTE — Assessment & Plan Note (Addendum)
Due for routine mammogram for screening Order placed, patient to schedule Follow-up result and with GYN as scheduled

## 2017-05-29 NOTE — Assessment & Plan Note (Signed)
Well-controlled HTN - Home BP readings normal  No known complications    Plan:  1. Discontinue Amlodipine 2.5mg  daily - since well controlled, and improve lifestyle now 2. Continue HCTZ 25mg  and Losartan 100mg  daily - consider titrate down thiazide in future 3. Encourage improved lifestyle - low sodium diet, regular exercise 4. Continue monitor BP outside office, bring readings to next visit, if persistently >140/90 or new symptoms notify office sooner 5. Follow-up 3 months HTN

## 2017-05-29 NOTE — Assessment & Plan Note (Signed)
Future fasting lipid ordered Follow-up result, calculate ASCVD risk and consider statin therapy vs ASA

## 2017-05-29 NOTE — Assessment & Plan Note (Signed)
Stable without significant complication, except has mild delayed healing and prior concern for superficial infection, now improved. 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 Continue to f/u with Neurosurgery as planned with imaging surveillance and incisional post op wound care 2. Reassurance today no clear evidence of infection

## 2017-05-29 NOTE — Assessment & Plan Note (Signed)
Asymptomatic, nearly due for next colonoscopy, patient to contact [facility] GI Dr Vira Agar to schedule. Last 2008.

## 2017-06-05 ENCOUNTER — Other Ambulatory Visit: Payer: Self-pay | Admitting: Family Medicine

## 2017-06-05 ENCOUNTER — Encounter: Payer: BLUE CROSS/BLUE SHIELD | Attending: Internal Medicine | Admitting: Internal Medicine

## 2017-06-05 DIAGNOSIS — I10 Essential (primary) hypertension: Secondary | ICD-10-CM

## 2017-06-05 DIAGNOSIS — T8131XD Disruption of external operation (surgical) wound, not elsewhere classified, subsequent encounter: Secondary | ICD-10-CM | POA: Diagnosis not present

## 2017-06-05 DIAGNOSIS — Y839 Surgical procedure, unspecified as the cause of abnormal reaction of the patient, or of later complication, without mention of misadventure at the time of the procedure: Secondary | ICD-10-CM | POA: Insufficient documentation

## 2017-06-05 DIAGNOSIS — L98498 Non-pressure chronic ulcer of skin of other sites with other specified severity: Secondary | ICD-10-CM | POA: Insufficient documentation

## 2017-06-05 DIAGNOSIS — T8189XA Other complications of procedures, not elsewhere classified, initial encounter: Secondary | ICD-10-CM | POA: Diagnosis not present

## 2017-06-06 NOTE — Progress Notes (Signed)
Meredith, Adams ([id number]) Visit Report for 06/05/2017 Chief Complaint Document Details Patient Name: [name], [name]. Date of Service: 06/05/2017 2:00 PM Medical Record Patient Account Number: [id number] Number: Treating RN: Cornell Barman Date of Birth/Sex: [birth year] (56 y.o. Female) Other Clinician: [place], Treating Shravya Wickwire Provider: Sheppard Coil Provider/Extender: G Referring Provider: [place] Siren in Treatment: 0 Information Obtained from: Patient Chief Complaint 06/05/17; patient is here for a nonhealing wound on her surgical scar in her occiput Electronic Signature(s) Signed: 06/05/2017 6:05:23 PM By: Linton Ham MD Entered By: Linton Ham on 06/05/2017 17:59:27 Mertz, [name] ([id number]) -------------------------------------------------------------------------------- Debridement Details Patient Name: Meredith, Adams. Date of Service: 06/05/2017 2:00 PM Medical Record Patient Account Number: [id number] Number: Treating RN: Cornell Barman Date of Birth/Sex: 24-Mar-1961 (56 y.o. Female) Other Clinician: [place], Treating Danelle Curiale Provider: Sheppard Coil Provider/Extender: G Referring Provider: [place] Siren in Treatment: 0 Debridement Performed for Wound #1 Right Head - Parietal Assessment: Performed By: Physician Ricard Dillon, MD Debridement: Open Wound/Selective Debridement Selective Description: Pre-procedure Verification/Time Out Yes - 15:20 Taken: Start Time: 15:21 Level: Non-Viable Tissue Total Area Debrided (L x 1 (cm) x 0.2 (cm) = 0.2 (cm) W): Tissue and other Non-Viable, Eschar material debrided: Instrument: Curette Bleeding: Minimum Hemostasis Achieved: Pressure End Time: 15:23 Procedural Pain: 0 Post Procedural Pain: 0 Response to Treatment: Procedure was tolerated well Post Debridement Measurements of Total Wound Length: (cm) 1 Width: (cm) 0.2 Depth: (cm)  0.2 Volume: (cm) 0.031 Character of Wound/Ulcer Post Improved Debridement: Post Procedure Diagnosis Same as Pre-procedure Electronic Signature(s) Signed: 06/05/2017 4:35:39 PM By: Gretta Cool, BSN, RN, CWS, Kim RN, BSN Signed: 06/05/2017 6:05:23 PM By: Linton Ham MD TYAIRA, HEWARD ([id number]) Entered By: Gretta Cool, BSN, RN, CWS, Kim on 06/05/2017 16:35:39 Chavie, Kolinski [name] ([id number]) -------------------------------------------------------------------------------- HPI Details Patient Name: Meredith, Adams. Date of Service: 06/05/2017 2:00 PM Medical Record Patient Account Number: [id number] Number: Treating RN: Cornell Barman Date of Birth/Sex: 1961/08/25 (56 y.o. Female) Other Clinician: [place], Treating Martino Tompson Provider: Sheppard Coil Provider/Extender: G Referring Provider: [place] Siren in Treatment: 0 History of Present Illness HPI Description: 06/05/17; this is a 56 year old woman who had actually been called about 2 weeks ago by her neurosurgeon Dr. Lacinda Axon. The story was that she had had a reasonably uneventful and successful meningioma resection in February of this year. She did well postoperatively and the substantial "L-shaped" wound on the scalp closed nicely. The patient tells me in some time in early April she developed a stinging sensation and there was apparently an open wound present. She saw Dr. Lacinda Axon who became concerned about an underlying infection. She hadn't a repeat MRI of the brain that apparently showed good postoperative surgical outcome without other issues. She then went on to have 2 different courses of Keflex as well as Hibiclens to the area without final success in getting this to heal. She is here for our review of this. She states that it will close over and then once again she gets the same I uncomfortable stinging sensation that results in nonhealing. Electronic Signature(s) Signed: 06/05/2017 6:05:23 PM By: Linton Ham  MD Entered By: Linton Ham on 06/05/2017 18:01:33 Randon, [name] ([id number]) -------------------------------------------------------------------------------- Physical Exam Details Patient Name: Meredith, Adams. Date of Service: 06/05/2017 2:00 PM Medical Record Patient Account Number: [id number] Number: Treating RN: Cornell Barman Date of Birth/Sex: 1961-02-19 (56 y.o. Female) Other Clinician: [place], Treating Shila Kruczek Provider: Sheppard Coil Provider/Extender: G Referring Provider:  COOK, STEVEN Weeks in Treatment: 0 Constitutional Sitting or standing Blood Pressure is within target range for patient.. Pulse regular and within target range for patient.Marland Kitchen Respirations regular, non-labored and within target range.. Temperature is normal and within the target range for the patient.Marland Kitchen appears in no distress. Eyes Conjunctivae clear. No discharge. Respiratory Respiratory effort is easy and symmetric bilaterally. Rate is normal at rest and on room air.Marland Kitchen Psychiatric No evidence of depression, anxiety, or agitation. Calm, cooperative, and communicative. Appropriate interactions and affect.. Notes Wound exam; the area is over the occiput on her scalp. There is scar tissue here from her extensive surgery however right at the angle of the L shape surgical scar with surface eschar. I removed this with a #3 curet to reveal a small open area. This had healthy appearing granulated base there was no evidence of infection however there is raised edges that one side of this and it is in the middle of scar tissue. Electronic Signature(s) Signed: 06/05/2017 6:05:23 PM By: Linton Ham MD Entered By: Linton Ham on 06/05/2017 18:02:40 [place], [name] ([id number]) -------------------------------------------------------------------------------- Physician Orders Details Patient Name: Meredith, Adams. Date of Service: 06/05/2017 2:00 PM Medical Record Patient Account  Number: [id number] Number: Treating RN: Cornell Barman Date of Birth/Sex: 1960/12/12 (56 y.o. Female) Other Clinician: [place], Treating Delmont Prosch Provider: Sheppard Coil Provider/Extender: G Referring Provider: [place] Siren in Treatment: 0 Verbal / Phone Orders: No Diagnosis Coding Wound Cleansing Wound #1 Right Head - Parietal o Clean wound with Normal Saline. Skin Barriers/Peri-Wound Care Wound #1 Right Head - Parietal o Skin Prep Primary Wound Dressing Wound #1 Right Head - Parietal o Prisma Ag Secondary Dressing o Other - Coverlet Dressing Change Frequency Wound #1 Right Head - Parietal o Change dressing every other day. Follow-up Appointments Wound #1 Right Head - Parietal o Return Appointment in 1 week. Electronic Signature(s) Signed: 06/05/2017 5:01:07 PM By: Gretta Cool, BSN, RN, CWS, Kim RN, BSN Signed: 06/05/2017 6:05:23 PM By: Linton Ham MD Entered By: Gretta Cool, BSN, RN, CWS, Kim on 06/05/2017 15:40:25 YEN, WANDELL ([id number]) -------------------------------------------------------------------------------- Problem List Details Patient Name: KORRIE, HOFBAUER. Date of Service: 06/05/2017 2:00 PM Medical Record Patient Account Number: [id number] Number: Treating RN: Cornell Barman Date of Birth/Sex: May 29, 1961 (56 y.o. Female) Other Clinician: [place], Treating Erland Vivas Provider: Sheppard Coil Provider/Extender: G Referring Provider: [place] Siren in Treatment: 0 Active Problems ICD-10 Encounter Code Description Active Date Diagnosis T81.31XD Disruption of external operation (surgical) wound, not 06/05/2017 Yes elsewhere classified, subsequent encounter L98.498 Non-pressure chronic ulcer of other sites with other 06/05/2017 Yes specified severity Inactive Problems Resolved Problems Electronic Signature(s) Signed: 06/05/2017 6:05:23 PM By: Linton Ham MD Entered By: Linton Ham  on 06/05/2017 17:58:53 Melchor, [name] ([id number]) -------------------------------------------------------------------------------- Progress Note Details Patient Name: AUDINE, MANGIONE. Date of Service: 06/05/2017 2:00 PM Medical Record Patient Account Number: [id number] Number: Treating RN: Cornell Barman Date of Birth/Sex: May 27, 1961 (56 y.o. Female) Other Clinician: [place], Treating Haeden Hudock Provider: Sheppard Coil Provider/Extender: G Referring Provider: [place] Siren in Treatment: 0 Subjective Chief Complaint Information obtained from Patient 06/05/17; patient is here for a nonhealing wound on her surgical scar in her occiput History of Present Illness (HPI) 06/05/17; this is a 56 year old woman who had actually been called about 2 weeks ago by her neurosurgeon Dr. Lacinda Axon. The story was that she had had a reasonably uneventful and successful meningioma resection in February of this year. She did well postoperatively and the substantial "  L-shaped" wound on the scalp closed nicely. The patient tells me in some time in early April she developed a stinging sensation and there was apparently an open wound present. She saw Dr. Lacinda Axon who became concerned about an underlying infection. She hadn't a repeat MRI of the brain that apparently showed good postoperative surgical outcome without other issues. She then went on to have 2 different courses of Keflex as well as Hibiclens to the area without final success in getting this to heal. She is here for our review of this. She states that it will close over and then once again she gets the same I uncomfortable stinging sensation that results in nonhealing. Wound History Patient presents with 1 open wound that has been present for approximately 5 months. Patient has been treating wound in the following manner: nothing. Laboratory tests have been performed in the last month. Patient reportedly has not tested positive  for an antibiotic resistant organism. Patient reportedly has not tested positive for osteomyelitis. Patient reportedly has not had testing performed to evaluate circulation in the legs. Patient History Information obtained from Patient. Allergies No Known Drug Allergies Family History Cancer - Father, Diabetes - Father, Mother, Hypertension - Mother, Father, Stroke - Mother, No family history of Heart Disease, Kidney Disease, Lung Disease, Seizures, Thyroid Problems, Tuberculosis. Social History AMAREE, LOISEL ([id number]) Never smoker, Marital Status - Married, Alcohol Use - Rarely, Drug Use - No History, Caffeine Use - Moderate. Medical History Eyes Denies history of Cataracts, Glaucoma, Optic Neuritis Ear/Nose/Mouth/Throat Patient has history of Chronic sinus problems/congestion - Seasonal allergies Denies history of Middle ear problems Hematologic/Lymphatic Denies history of Anemia, Hemophilia, Human Immunodeficiency Virus, Lymphedema, Sickle Cell Disease Respiratory Denies history of Aspiration, Asthma, Chronic Obstructive Pulmonary Disease (COPD), Pneumothorax, Sleep Apnea, Tuberculosis Cardiovascular Patient has history of Hypertension Denies history of Angina, Arrhythmia, Congestive Heart Failure, Coronary Artery Disease, Deep Vein Thrombosis, Hypotension, Myocardial Infarction, Peripheral Arterial Disease, Peripheral Venous Disease, Phlebitis, Vasculitis Gastrointestinal Denies history of Cirrhosis , Colitis, Crohn s, Hepatitis A, Hepatitis B, Hepatitis C Endocrine Denies history of Type I Diabetes, Type II Diabetes Genitourinary Denies history of End Stage Renal Disease Immunological Denies history of Lupus Erythematosus, Raynaud s, Scleroderma Integumentary (Skin) Denies history of History of Burn, History of pressure wounds Musculoskeletal Denies history of Gout, Rheumatoid Arthritis, Osteoarthritis, Osteomyelitis Neurologic Denies history of Dementia,  Neuropathy, Quadriplegia, Paraplegia, Seizure Disorder Oncologic Denies history of Received Chemotherapy, Received Radiation Psychiatric Denies history of Anorexia/bulimia, Confinement Anxiety Medical And Surgical History Notes Constitutional Symptoms (General Health) Blood Pressure; Brain Tumor Meningioma- February 2018 ([facility]) Review of Systems (ROS) Constitutional Symptoms (General Health) Complains or has symptoms of Fatigue. Denies complaints or symptoms of Fever, Chills, Marked Weight Change. Eyes The patient has no complaints or symptoms. Ear/Nose/Mouth/Throat The patient has no complaints or symptoms. Hematologic/Lymphatic FATMATA, LEGERE. ([id number]) The patient has no complaints or symptoms. Respiratory The patient has no complaints or symptoms. Cardiovascular Complains or has symptoms of LE edema - left foot and ankle. Denies complaints or symptoms of Chest pain. Gastrointestinal The patient has no complaints or symptoms. Endocrine The patient has no complaints or symptoms. Genitourinary The patient has no complaints or symptoms. Immunological Complains or has symptoms of Itching - wound areas. Integumentary (Skin) Complains or has symptoms of Wounds, Bleeding or bruising tendency - bruises easliy. Denies complaints or symptoms of Breakdown, Swelling. Musculoskeletal The patient has no complaints or symptoms. Neurologic The patient has no complaints or symptoms. Oncologic The patient has no complaints  or symptoms, Meningioma-February 2018 Psychiatric The patient has no complaints or symptoms. Objective Constitutional Sitting or standing Blood Pressure is within target range for patient.. Pulse regular and within target range for patient.Marland Kitchen Respirations regular, non-labored and within target range.. Temperature is normal and within the target range for the patient.Marland Kitchen appears in no distress. Vitals Time Taken: 2:22 PM, Height: 63 in, Weight: 182 lbs, BMI: 32.2,  Temperature: 98.4 F, Pulse: 66 bpm, Respiratory Rate: 16 breaths/min, Blood Pressure: 131/57 mmHg. Eyes Conjunctivae clear. No discharge. Respiratory Respiratory effort is easy and symmetric bilaterally. Rate is normal at rest and on room air.Marland Kitchen CHRISTYANN, MANOLIS. ([id number]) Psychiatric No evidence of depression, anxiety, or agitation. Calm, cooperative, and communicative. Appropriate interactions and affect.. General Notes: Wound exam; the area is over the occiput on her scalp. There is scar tissue here from her extensive surgery however right at the angle of the L shape surgical scar with surface eschar. I removed this with a #3 curet to reveal a small open area. This had healthy appearing granulated base there was no evidence of infection however there is raised edges that one side of this and it is in the middle of scar tissue. Integumentary (Hair, Skin) Wound #1 status is Open. Original cause of wound was Surgical Injury. The wound is located on the Right Head - Parietal. The wound measures 1cm length x 0.2cm width x 0.1cm depth; 0.157cm^2 area and 0.016cm^3 volume. The wound is limited to skin breakdown. There is no tunneling or undermining noted. There is a none present amount of drainage noted. The wound margin is flat and intact. There is no granulation within the wound bed. There is a large (67-100%) amount of necrotic tissue within the wound bed including Adherent Slough. Assessment Active Problems ICD-10 T81.31XD - Disruption of external operation (surgical) wound, not elsewhere classified, subsequent encounter L98.498 - Non-pressure chronic ulcer of other sites with other specified severity Procedures Wound #1 Pre-procedure diagnosis of Wound #1 is an Open Surgical Wound located on the Right Head - Parietal . There was a Non-Viable Tissue Open Wound/Selective [id number]) debridement with total area of 0.2 sq cm performed by Ricard Dillon, MD. with the following  instrument(s): Curette to remove Non- Viable tissue/material including Eschar. A time out was conducted at 15:20, prior to the start of the procedure. A Minimum amount of bleeding was controlled with Pressure. The procedure was tolerated well with a pain level of 0 throughout and a pain level of 0 following the procedure. Post Debridement Measurements: 1cm length x 0.2cm width x 0.2cm depth; 0.031cm^3 volume. Character of Wound/Ulcer Post Debridement is improved. Post procedure Diagnosis Wound #1: Same as Pre-Procedure ALECIA, DOI. ([id number]) Plan Wound Cleansing: Wound #1 Right Head - Parietal: Clean wound with Normal Saline. Skin Barriers/Peri-Wound Care: Wound #1 Right Head - Parietal: Skin Prep Primary Wound Dressing: Wound #1 Right Head - Parietal: Prisma Ag Secondary Dressing: Other - Coverlet Dressing Change Frequency: Wound #1 Right Head - Parietal: Change dressing every other day. Follow-up Appointments: Wound #1 Right Head - Parietal: Return Appointment in 1 week. #1 post debridement the wound at base of this actually looked quite healthy although it is in an awkward spot with raised edges around one of the edges. This is obviously in the middle of scar tissue. We use moist silver collagen with a Band-Aid to hold this in place. I did not see any evidence of infection. Electronic Signature(s) Signed: 06/05/2017 6:05:23 PM By: Linton Ham MD Entered By: Dellia Nims,  Erastus Bartolomei on 06/05/2017 18:03:30 Begue, [name] ([id number]) -------------------------------------------------------------------------------- ROS/PFSH Details Patient Name: EVALENA, FUJII. Date of Service: 06/05/2017 2:00 PM Medical Record Patient Account Number: [id number] Number: Treating RN: Cornell Barman Date of Birth/Sex: Apr 05, 1961 (56 y.o. Female) Other Clinician: [place], Treating Ryelan Kazee Provider: Sheppard Coil Provider/Extender: G Referring Provider: [place] Siren in Treatment: 0 Information Obtained From Patient Wound History Do you currently have one or more open woundso Yes How many open wounds do you currently haveo 1 Approximately how long have you had your woundso 5 months How have you been treating your wound(s) until nowo nothing Has your wound(s) ever healed and then re-openedo No Have you had any lab work done in the past montho Yes Have you tested positive for an antibiotic resistant organism (MRSA, VRE)o No Have you tested positive for osteomyelitis (bone infection)o No Have you had any tests for circulation on your legso No Constitutional Symptoms (General Health) Complaints and Symptoms: Positive for: Fatigue Negative for: Fever; Chills; Marked Weight Change Medical History: Past Medical History Notes: Blood Pressure; Brain Tumor Meningioma- February 2018 ([facility]) Cardiovascular Complaints and Symptoms: Positive for: LE edema - left foot and ankle Negative for: Chest pain Medical History: Positive for: Hypertension Negative for: Angina; Arrhythmia; Congestive Heart Failure; Coronary Artery Disease; Deep Vein Thrombosis; Hypotension; Myocardial Infarction; Peripheral Arterial Disease; Peripheral Venous Disease; Phlebitis; Vasculitis Endocrine Complaints and Symptoms: No Complaints or Symptoms LANDIS, CASSARO. ([id number]) Complaints and Symptoms: Negative for: Hepatitis; Thyroid disease; Polydypsia (Excessive Thirst) Medical History: Negative for: Type I Diabetes; Type II Diabetes Genitourinary Complaints and Symptoms: No Complaints or Symptoms Complaints and Symptoms: Negative for: Kidney failure/ Dialysis; Incontinence/dribbling Medical History: Negative for: End Stage Renal Disease Immunological Complaints and Symptoms: Positive for: Itching - wound areas Medical History: Negative for: Lupus Erythematosus; Raynaudos; Scleroderma Integumentary (Skin) Complaints and Symptoms: Positive for: Wounds;  Bleeding or bruising tendency - bruises easliy Negative for: Breakdown; Swelling Medical History: Negative for: History of Burn; History of pressure wounds Eyes Complaints and Symptoms: No Complaints or Symptoms Medical History: Negative for: Cataracts; Glaucoma; Optic Neuritis Ear/Nose/Mouth/Throat Complaints and Symptoms: No Complaints or Symptoms Medical History: Positive for: Chronic sinus problems/congestion - Seasonal allergies Negative for: Middle ear problems JONEL, SICK. ([id number]) Hematologic/Lymphatic Complaints and Symptoms: No Complaints or Symptoms Medical History: Negative for: Anemia; Hemophilia; Human Immunodeficiency Virus; Lymphedema; Sickle Cell Disease Respiratory Complaints and Symptoms: No Complaints or Symptoms Medical History: Negative for: Aspiration; Asthma; Chronic Obstructive Pulmonary Disease (COPD); Pneumothorax; Sleep Apnea; Tuberculosis Gastrointestinal Complaints and Symptoms: No Complaints or Symptoms Medical History: Negative for: Cirrhosis ; Colitis; Crohnos; Hepatitis A; Hepatitis B; Hepatitis C Musculoskeletal Complaints and Symptoms: No Complaints or Symptoms Medical History: Negative for: Gout; Rheumatoid Arthritis; Osteoarthritis; Osteomyelitis Neurologic Complaints and Symptoms: No Complaints or Symptoms Medical History: Negative for: Dementia; Neuropathy; Quadriplegia; Paraplegia; Seizure Disorder Oncologic Complaints and Symptoms: No Complaints or Symptoms Complaints and Symptoms: Review of System Notes: Meningioma-February 2018 Medical History: Negative for: Received Chemotherapy; Received Radiation ESSANCE, GATTI ([id number]) Psychiatric Complaints and Symptoms: No Complaints or Symptoms Medical History: Negative for: Anorexia/bulimia; Confinement Anxiety HBO Extended History Items Ear/Nose/Mouth/Throat: Chronic sinus problems/congestion Immunizations Pneumococcal Vaccine: Received Pneumococcal  Vaccination: No Family and Social History Cancer: Yes - Father; Diabetes: Yes - Father, Mother; Heart Disease: No; Hypertension: Yes - Mother, Father; Kidney Disease: No; Lung Disease: No; Seizures: No; Stroke: Yes - Mother; Thyroid Problems: No; Tuberculosis: No; Never smoker; Marital Status - Married; Alcohol Use: Rarely; Drug Use: No History; Caffeine  Use: Moderate; Advanced Directives: No; Patient does not want information on Advanced Directives; Living Will: No; Medical Power of Attorney: No Electronic Signature(s) Signed: 06/05/2017 5:01:07 PM By: Gretta Cool, BSN, RN, CWS, Kim RN, BSN Signed: 06/05/2017 6:05:23 PM By: Linton Ham MD Entered By: Gretta Cool, BSN, RN, CWS, Kim on 06/05/2017 14:37:25 Diez, [name] ([id number]) -------------------------------------------------------------------------------- [place] Details Patient Name: WILLIEMAE, MURIEL. Date of Service: 06/05/2017 Medical Record Patient Account Number: [id number] Number: Treating RN: Cornell Barman Date of Birth/Sex: [birth year] (56 y.o. 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 with other specified severity Facility Procedures CPT4: Description Modifier Quantity Code 19758832 99212 - WOUND CARE VISIT-LEV 2 EST PT 1 CPT4: 54982641 97597 - DEBRIDE WOUND 1ST 20 SQ CM OR < 1 ICD-10 Description Diagnosis T81.31XD Disruption of external operation (surgical) wound, not elsewhere classified, subsequent encounter Physician Procedures CPT4: Description Modifier Quantity Code [id number] 76808 - WC PHYS LEVEL 2 - NEW PT 1 ICD-10 Description Diagnosis T81.31XD Disruption of external operation  (surgical) wound, not elsewhere classified, subsequent encounter L98.498 Non-pressure chronic ulcer  of other sites with other specified severity CPT4: 8110315 94585 - WC PHYS DEBR WO ANESTH 20 SQ CM 1 ICD-10 Description Diagnosis T81.31XD Disruption of external operation (surgical) wound, not elsewhere classified, subsequent encounter DANITZA, SCHOENFELDT ([id number]) Electronic Signature(s) Signed: 06/05/2017 6:05:23 PM By: Linton Ham MD Entered By: Linton Ham on 06/05/2017 18:04:00

## 2017-06-06 NOTE — Progress Notes (Signed)
KIEREN, RICCI ([id number]) Visit Report for 06/05/2017 Abuse/Suicide Risk Screen Details Patient Name: [name], [name]. Date of Service: 06/05/2017 2:00 PM Medical Record Patient Account Number: [id number] Number: Treating RN: Cornell Barman Date of Birth/Sex: [birth year] (55 y.o. Female) Other Clinician: [place], Treating ROBSON, MICHAEL Jakyri Brunkhorst: Sheppard Coil Sayan Aldava/Extender: G Referring Andreah Goheen: [place] Si[place] in Treatment: 0 Abuse/Suicide Risk Screen Items Answer ABUSE/SUICIDE RISK SCREEN: Has anyone close to you tried to hurt or harm you recentlyo No Do you feel uncomfortable with anyone in your familyo No Has anyone forced you do things that you didnot want to doo No Do you have any thoughts of harming yourselfo No Patient displays signs or symptoms of abuse and/or neglect. No Electronic Signature(s) Signed: 06/05/2017 5:01:07 PM By: Gretta Cool, BSN, RN, CWS, Kim RN, BSN Entered By: Gretta Cool, BSN, RN, CWS, Kim on 06/05/2017 14:37:42 Takeshita, [name] ([id number]) -------------------------------------------------------------------------------- Activities of Daily Living Details Patient Name: [name], [name]. Date of Service: 06/05/2017 2:00 PM Medical Record Patient Account Number: [id number] Number: Treating RN: Cornell Barman Date of Birth/Sex: August 16, 1961 (55 y.o. Female) Other Clinician: [place], Treating ROBSON, MICHAEL Nomie Buchberger: Sheppard Coil Kwamane Whack/Extender: G Referring Dawnna Gritz: [place] Si[place] in Treatment: 0 Activities of Daily Living Items Answer Activities of Daily Living (Please select one for each item) Drive Automobile Completely Able Take Medications Completely Able Use Telephone Completely Able Care for Appearance Completely Able Use Toilet Completely Able Bath / Shower Completely Able Dress Self Completely Able Feed Self Completely Able Walk Completely Able Get In / Out Bed Completely Able Housework Completely  Able Prepare Meals Completely [place] Completely Able Shop for Self Completely Able Electronic Signature(s) Signed: 06/05/2017 5:01:07 PM By: Gretta Cool, BSN, RN, CWS, Kim RN, BSN Entered By: Gretta Cool, BSN, RN, CWS, Kim on 06/05/2017 14:37:55 Jaffe, [name] ([id number]) -------------------------------------------------------------------------------- Education Assessment Details Patient Name: [name], [name]. Date of Service: 06/05/2017 2:00 PM Medical Record Patient Account Number: [id number] Number: Treating RN: Cornell Barman Date of Birth/Sex: November 20, 1960 (55 y.o. Female) Other Clinician: [place], Treating ROBSON, MICHAEL Jermine Bibbee: Sheppard Coil Abbe Bula/Extender: G Referring Jayla Mackie: [place] Si[place] in Treatment: 0 Primary Learner Assessed: Patient Learning Preferences/Education Level/Primary Language Learning Preference: Explanation, Demonstration Highest Education Level: High School Preferred Language: English Cognitive Barrier Assessment/Beliefs Language Barrier: No Translator Needed: No Memory Deficit: No Emotional Barrier: No Cultural/Religious Beliefs Affecting Medical No Care: Physical Barrier Assessment Impaired Vision: No Impaired Hearing: No Decreased Hand dexterity: No Knowledge/Comprehension Assessment Knowledge Level: High Comprehension Level: High Ability to understand written High instructions: Ability to understand verbal High instructions: Motivation Assessment Anxiety Level: Calm Cooperation: Cooperative Education Importance: Acknowledges Need Interest in Health Problems: Asks Questions Perception: Coherent Willingness to Engage in Self- High Management Activities: Readiness to Engage in Self- High Management Activities: [name], [name] ([id number]) Electronic Signature(s) Signed: 06/05/2017 5:01:07 PM By: Gretta Cool, BSN, RN, CWS, Kim RN, BSN Entered By: Gretta Cool, BSN, RN, CWS, Kim on 06/05/2017 14:38:26 [name], [name]  ([id number]) -------------------------------------------------------------------------------- Fall Risk Assessment Details Patient Name: [name], [name]. Date of Service: 06/05/2017 2:00 PM Medical Record Patient Account Number: [id number] Number: Treating RN: Cornell Barman Date of Birth/Sex: Apr 18, 1961 (55 y.o. Female) Other Clinician: [place], Treating ROBSON, MICHAEL Harlene Petralia: Sheppard Coil Lanique Gonzalo/Extender: G Referring Tammara Massing: [place] Si[place] in Treatment: 0 Fall Risk Assessment Items Have you had 2 or more falls in the last 12 monthso 0 No Have you had any fall that resulted in injury in the last 12 monthso 0 No FALL  RISK ASSESSMENT: History of falling - immediate or within 3 months 25 Yes Secondary diagnosis 0 No Ambulatory aid None/bed rest/wheelchair/nurse 0 Yes Crutches/cane/walker 0 No Furniture 0 No IV Access/Saline Lock 0 No Gait/Training Normal/bed rest/immobile 0 Yes Weak 0 No Impaired 0 No Mental Status Oriented to own ability 0 Yes Electronic Signature(s) Signed: 06/05/2017 5:01:07 PM By: Gretta Cool, BSN, RN, CWS, Kim RN, BSN Entered By: Gretta Cool, BSN, RN, CWS, Kim on 06/05/2017 14:39:06 Kerri Perches ([id number]) -------------------------------------------------------------------------------- Nutrition Risk Assessment Details Patient Name: [name], [name]. Date of Service: 06/05/2017 2:00 PM Medical Record Patient Account Number: [id number] Number: Treating RN: Cornell Barman Date of Birth/Sex: Sep 01, 1961 (55 y.o. Female) Other Clinician: [place], Treating ROBSON, MICHAEL Saadia Dewitt: Sheppard Coil Deshun Sedivy/Extender: G Referring Paytin Ramakrishnan: [place] Si[place] in Treatment: 0 Height (in): 63 Weight (lbs): 182 Body Mass Index (BMI): 32.2 Nutrition Risk Assessment Items NUTRITION RISK SCREEN: I have an illness or condition that made me change the kind and/or 0 No amount of food I eat I eat fewer than two meals per day 0  No I eat few fruits and vegetables, or milk products 0 No I have three or more drinks of beer, liquor or wine almost every day 0 No I have tooth or mouth problems that make it hard for me to eat 0 No I don't always have enough money to buy the food I need 0 No I eat alone most of the time 0 No I take three or more different prescribed or over-the-counter drugs a 0 No day Without wanting to, I have lost or gained 10 pounds in the last six 0 No months I am not always physically able to shop, cook and/or feed myself 0 No Nutrition Protocols Good Risk Protocol 0 No interventions needed Moderate Risk Protocol Electronic Signature(s) Signed: 06/05/2017 5:01:07 PM By: Gretta Cool, BSN, RN, CWS, Kim RN, BSN Entered By: Gretta Cool, BSN, RN, CWS, Kim on 06/05/2017 14:39:14

## 2017-06-07 NOTE — Progress Notes (Signed)
[name], [name] ([id number]) Visit Report for 06/05/2017 Allergy List Details Patient Name: [name], GENO. Date of Service: 06/05/2017 2:00 PM Medical Record Patient Account Number: [id number] Number: Treating RN: [name] Date of Birth/Sex: 1961-01-05 (55 y.o. Female) Other Clinician: Scherry Adams Primary Care [name]: [name] [name]/Extender: [name] [name]: [name] in Treatment: 0 Allergies Active Allergies No Known Drug Allergies Allergy Notes Electronic Signature(s) Signed: 06/05/2017 5:01:07 PM By: Gretta Cool, BSN, RN, [name] Entered By: Gretta Cool, BSN, RN, CWS, [name] on 06/05/2017 14:23:18 [name] ([id number]) -------------------------------------------------------------------------------- Arrival Information Details Patient Name: [name], [name] Date of Service: 06/05/2017 2:00 PM Medical Record Patient Account Number: [id number] Number: Treating RN: [name] Date of Birth/Sex: June 10, 1961 (55 y.o. Female) Other Clinician: Scherry Adams Primary Care [name]: [name] [name]/Extender: [name] [name]: [name] in Treatment: 0 Visit Information Patient Arrived: Ambulatory Arrival Time: 14:14 Accompanied By: daughter Transfer Assistance: None Patient Identification Verified: Yes Secondary Verification Process Yes Completed: Patient Requires Transmission-Based No Precautions: Patient Has Alerts: No Electronic Signature(s) Signed: 06/05/2017 5:01:07 PM By: Gretta Cool, BSN, RN, [name] Entered By: Gretta Cool, BSN, RN, CWS, [name] on 06/05/2017 14:21:52 [name] ([id number]) -------------------------------------------------------------------------------- Clinic Level of Care Assessment Details Patient Name: [name], [name]. Date of Service: 06/05/2017 2:00 PM Medical Record Patient Account Number: [id number] Number: Treating RN: [name] Date of Birth/Sex: [birth year] (55 y.o. Female) Other Clinician: Scherry Adams Primary Care [name]: [name] [name]/Extender: [name] [name]: [name] in Treatment: 0 Clinic Level of Care Assessment Items TOOL 1 Quantity Score []  - Use when EandM and Procedure is performed on INITIAL visit 0 ASSESSMENTS - Nursing Assessment / Reassessment X - General Physical Exam (combine w/ comprehensive assessment (listed just 1 20 below) when performed on new pt. evals) X - Comprehensive Assessment (HX, ROS, Risk Assessments, Wounds Hx, etc.) 1 25 ASSESSMENTS - Wound and Skin Assessment / Reassessment []  - Dermatologic / Skin Assessment (not related to wound area) 0 ASSESSMENTS - Ostomy and/or Continence Assessment and Care []  - Incontinence Assessment and Management 0 []  - Ostomy Care Assessment and Management (repouching, etc.) 0 PROCESS - Coordination of Care X - Simple Patient / Family Education for ongoing care 1 15 []  - Complex (extensive) Patient / Family Education for ongoing care 0 []  - Staff obtains Programmer, systems, Records, Test Results / Process Orders 0 []  - Staff telephones HHA, Nursing Homes / Clarify orders / etc 0 []  - Routine Transfer to another Facility (non-emergent condition) 0 []  - Routine Hospital Admission (non-emergent condition) 0 X - New Admissions / Biomedical engineer / Ordering NPWT, Apligraf, etc. 1 15 []  - Emergency Hospital Admission (emergent condition) 0 PROCESS - Special Needs []  - Pediatric / Minor Patient Management 0 XITLALI, [name] ([id number]) []  - Isolation Patient Management 0 []  - Hearing / Language / Visual special needs 0 []  - Assessment of Community assistance (transportation, D/C planning, etc.) 0 []  - Additional assistance / Altered mentation 0 []  - Support Surface(s) Assessment (bed, cushion, seat, etc.) 0 INTERVENTIONS - Miscellaneous []  - External ear exam 0 []  - Patient Transfer (multiple staff  / Civil Service fast streamer / Similar devices) 0 []  - Simple Staple / Suture removal (25 or less) 0 []  - Complex Staple / Suture removal (26 or more) 0 []  - Hypo/Hyperglycemic Management (do not check if billed separately) 0 []  - Ankle / Brachial Index (ABI) - do not check if  billed separately 0 Has the patient been seen at the hospital within the last three years: Yes Total Score: 75 Level Of Care: New/Established - Level 2 Electronic Signature(s) Signed: 06/05/2017 5:01:07 PM By: Gretta Cool, BSN, RN, [name] Entered By: Gretta Cool, BSN, RN, CWS, [name] on 06/05/2017 16:36:01 [name] ([id number]) -------------------------------------------------------------------------------- Encounter Discharge Information Details Patient Name: [name], [name] Date of Service: 06/05/2017 2:00 PM Medical Record Patient Account Number: [id number] Number: Treating RN: [name] Date of Birth/Sex: November 26, 1960 (55 y.o. Female) Other Clinician: Scherry Adams Primary Care [name]: [name] [name]: [name] [name]: [name] in Treatment: 0 Encounter Discharge Information Items Discharge Pain Level: 0 Discharge Condition: Stable Ambulatory Status: Ambulatory Discharge Destination: Home Transportation: Private Auto daughter and Accompanied By: grandson Schedule Follow-up Appointment: Yes Medication Reconciliation completed and provided to Patient/Care Yes [name]: Provided on Clinical Summary of Care: 06/05/2017 Form Type Recipient Paper Patient DP Electronic Signature(s) Signed: 06/05/2017 4:37:08 PM By: Gretta Cool, BSN, RN, [name] Previous Signature: 06/05/2017 3:40:43 PM Version By: [name] Entered By: Gretta Cool BSN, RN, CWS, [name] on 06/05/2017 16:37:08 [name] ([id number]) -------------------------------------------------------------------------------- Lower Extremity Assessment Details Patient Name: [name], [name] Date of  Service: 06/05/2017 2:00 PM Medical Record Patient Account Number: [id number] Number: Treating RN: [name] Date of Birth/Sex: 11/23/1960 (55 y.o. Female) Other Clinician: Scherry Adams Primary Care Rayvion Stumph: [name] Adeyemi Hamad/Extender: [name] Bexley Mclester: [name] in Treatment: 0 Electronic Signature(s) Signed: 06/05/2017 5:01:07 PM By: Gretta Cool, BSN, RN, [name] Entered By: Gretta Cool, BSN, RN, CWS, [name] on 06/05/2017 14:39:31 Stettler, [name] ([id number]) -------------------------------------------------------------------------------- Multi Wound Chart Details Patient Name: [name], LONGFIELD. Date of Service: 06/05/2017 2:00 PM Medical Record Patient Account Number: [id number] Number: Treating RN: [name] Date of Birth/Sex: [birth year] (55 y.o. Female) Other Clinician: Scherry Adams Primary Care Ziasia Lenoir: [name] Jesilyn Easom/Extender: [name] Prisma Decarlo: [name] in Treatment: 0 Vital Signs Height(in): 63 Pulse(bpm): 66 Weight(lbs): 182 Blood Pressure 131/57 (mmHg): Body Mass Index(BMI): 32 Temperature(F): 98.4 Respiratory Rate 16 (breaths/min): Photos: [N/A:N/A] Wound Location: Right Head - Parietal N/A N/A Wounding Event: Surgical Injury N/A N/A Primary Etiology: Open Surgical Wound N/A N/A Comorbid History: Chronic sinus N/A N/A problems/congestion, Hypertension Date Acquired: 12/25/2016 N/A N/A Weeks of Treatment: 0 N/A N/A Wound Status: Open N/A N/A Measurements L x W x D 1x0.2x0.1 N/A N/A (cm) Area (cm) : 0.157 N/A N/A Volume (cm) : 0.016 N/A N/A % Reduction in Area: 0.00% N/A N/A % Reduction in Volume: 0.00% N/A N/A Classification: Partial Thickness N/A N/A Exudate Amount: None Present N/A N/A [name], HESSION. ([id number]) Wound Margin: Flat and Intact N/A N/A Granulation Amount: None Present (0%) N/A N/A Necrotic Amount: Large (67-100%) N/A N/A Exposed  Structures: Fascia: No N/A N/A Fat Layer (Subcutaneous Tissue) Exposed: No Tendon: No Muscle: No Joint: No Bone: No Limited to Skin Breakdown Epithelialization: Small (1-33%) N/A N/A Periwound Skin Texture: No Abnormalities Noted N/A N/A Periwound Skin No Abnormalities Noted N/A N/A Moisture: Periwound Skin Color: No Abnormalities Noted N/A N/A Tenderness on No N/A N/A Palpation: Wound Preparation: Ulcer Cleansing: Not N/A N/A Cleansed Topical Anesthetic Applied: None Treatment Notes Electronic Signature(s) Signed: 06/05/2017 4:33:22 PM By: Gretta Cool, BSN, RN, [name] Entered By: Gretta Cool, BSN, RN, CWS, [name] on 06/05/2017 16:33:22 [name] ([id number]) -------------------------------------------------------------------------------- Multi-Disciplinary Care Plan Details Patient Name: [name], DOUTHITT. Date of Service: 06/05/2017 2:00 PM Medical Record Patient Account Number:  [id number] Number: Treating RN: [name] Date of Birth/Sex: [birth year] (55 y.o. Female) Other Clinician: Scherry Adams Primary Care Deedee Lybarger: [name] Tammye Kahler/Extender: [name] Braeden Dolinski: [name] in Treatment: 0 Active Inactive ` Orientation to the Wound Care Program Nursing Diagnoses: Knowledge deficit related to the wound healing center program Goals: Patient/caregiver will verbalize understanding of the [facility] Program Date Initiated: 06/05/2017 Target Resolution Date: 06/05/2017 Goal Status: Active Interventions: Provide education on orientation to the wound center Notes: ` Wound/Skin Impairment Nursing Diagnoses: Impaired tissue integrity Goals: Ulcer/skin breakdown will heal within 14 weeks Date Initiated: 06/05/2017 Target Resolution Date: 09/05/2017 Goal Status: Active Interventions: Assess patient/caregiver ability to obtain necessary supplies Assess ulceration(s) every visit Treatment Activities: Skin care  regimen initiated : 06/05/2017 Topical wound management initiated : 06/05/2017 Notes: TAYLORMARIE, REGISTER ([id number]) Electronic Signature(s) Signed: 06/05/2017 4:27:46 PM By: Gretta Cool, BSN, RN, [name] Entered By: Gretta Cool, BSN, RN, CWS, [name] on 06/05/2017 16:27:46 Pagaduan, [name] ([id number]) -------------------------------------------------------------------------------- Pain Assessment Details Patient Name: [name], ERNEY. Date of Service: 06/05/2017 2:00 PM Medical Record Patient Account Number: [id number] Number: Treating RN: [name] Date of Birth/Sex: May 15, 1961 (55 y.o. Female) Other Clinician: Scherry Adams Primary Care Isauro Skelley: [name] Navon Kotowski/Extender: [name] Hawthorne Day: [name] in Treatment: 0 Active Problems Location of Pain Severity and Description of Pain Patient Has Paino No Site Locations With Dressing Change: No Pain Management and Medication Current Pain Management: Goals for Pain Management Topical or injectable lidocaine is offered to patient for acute pain when surgical debridement is performed. If needed, Patient is instructed to use over the counter pain medication for the following 24-48 hours after debridement. Wound care MDs do not prescribed pain medications. Patient has chronic pain or uncontrolled pain. Patient has been instructed to make an appointment with their Primary Care Physician for pain management. Electronic Signature(s) Signed: 06/05/2017 5:01:07 PM By: Gretta Cool, BSN, RN, [name] Entered By: Gretta Cool, BSN, RN, CWS, [name] on 06/05/2017 14:22:21 [name] ([id number]) -------------------------------------------------------------------------------- Patient/Caregiver Education Details Patient Name: [name], KAUFMAN. Date of Service: 06/05/2017 2:00 PM Medical Record Patient Account Number: [id number] Number: Treating RN: [name] Date of Birth/Gender: 1961-07-04 (56 y.o.  Female) Other Clinician: [place], Treating ROBSON, Henderson Physician: [name] Physician/Extender: [name] Physician: [name] in Treatment: 0 Education Assessment Education Provided To: Patient Education Topics Provided Welcome To The [facility]: Handouts: Welcome To [facility] Methods: Demonstration, Explain/Verbal Responses: State content correctly Wound/Skin Impairment: Handouts: Caring for Your Ulcer, Other: wound care as prescribed Methods: Demonstration, Explain/Verbal Responses: State content correctly Electronic Signature(s) Signed: 06/05/2017 5:01:07 PM By: Gretta Cool, BSN, RN, [name] Entered By: Gretta Cool, BSN, RN, CWS, [name] on 06/05/2017 16:37:36 Nasby, [name] ([id number]) -------------------------------------------------------------------------------- Wound Assessment Details Patient Name: [name], HOBDAY. Date of Service: 06/05/2017 2:00 PM Medical Record Patient Account Number: [id number] Number: Treating RN: [name] Date of Birth/Sex: Mar 31, 1961 (55 y.o. Female) Other Clinician: Jetty Duhamel MICHAEL Primary Care Djibril Glogowski: [name] Kinze Labo/Extender: [name] Mansur Patti: [name] in Treatment: 0 Wound Status Wound Number: 1 Primary Open Surgical Wound Etiology: Wound Location: Right Head - Parietal Wound Open Wounding Event: Surgical Injury Status: Date Acquired: 12/25/2016 Comorbid Chronic sinus problems/congestion, Weeks Of Treatment: 0 History: Hypertension Clustered Wound: No Photos Wound Measurements Length: (cm) 1 Width: (cm) 0.2 Depth: (cm) 0.1 Area: (cm) 0.157 Volume: (cm) 0.016 % Reduction in Area: 0% % Reduction in  Volume: 0% Epithelialization: Small (1-33%) Tunneling: No Undermining: No Wound Description Classification: Partial Thickness Wound Margin: Flat and Intact Exudate Amount: None Present Foul Odor After Cleansing:  No Slough/Fibrino No Wound Bed Granulation Amount: None Present (0%) Exposed Structure Necrotic Amount: Large (67-100%) Fascia Exposed: No Necrotic Quality: Adherent Slough Fat Layer (Subcutaneous Tissue) Exposed: No Tendon Exposed: No Muscle Exposed: No Joint Exposed: No Bone Exposed: No Limited to Skin Breakdown [name], ROBEL. ([id number]) Periwound Skin Texture Texture Color No Abnormalities Noted: No No Abnormalities Noted: No Moisture No Abnormalities Noted: No Wound Preparation Ulcer Cleansing: Not Cleansed Topical Anesthetic Applied: None Treatment Notes Wound #1 (Right Head - Parietal) 1. Cleansed with: Clean wound with Normal Saline 3. Peri-wound Care: Skin Prep 4. Dressing Applied: Prisma Ag Notes Coverlet to secure Electronic Signature(s) Signed: 06/05/2017 4:31:32 PM By: Gretta Cool, BSN, RN, [name] Entered By: Gretta Cool, BSN, RN, CWS, [name] on 06/05/2017 16:31:31 [name] ([id number]) -------------------------------------------------------------------------------- Vitals Details Patient Name: CARLA, RASHAD. Date of Service: 06/05/2017 2:00 PM Medical Record Patient Account Number: [id number] Number: Treating RN: [name] Date of Birth/Sex: July 14, 1961 (55 y.o. Female) Other Clinician: Jetty Duhamel MICHAEL Primary Care Maycel Riffe: [name] Freyja Govea/Extender: [name] Berenice Oehlert: [name] in Treatment: 0 Vital Signs Time Taken: 14:22 Temperature (F): 98.4 Height (in): 63 Pulse (bpm): 66 Weight (lbs): 182 Respiratory Rate (breaths/min): 16 Body Mass Index (BMI): 32.2 Blood Pressure (mmHg): 131/57 Reference Range: 80 - 120 mg / dl Electronic Signature(s) Signed: 06/05/2017 5:01:07 PM By: Gretta Cool, BSN, RN, [name] Entered By: Gretta Cool, BSN, RN, CWS, [name] on 06/05/2017 14:22:59

## 2017-06-13 ENCOUNTER — Encounter: Payer: BLUE CROSS/BLUE SHIELD | Attending: Physician Assistant | Admitting: Physician Assistant

## 2017-06-13 DIAGNOSIS — T8131XD Disruption of external operation (surgical) wound, not elsewhere classified, subsequent encounter: Secondary | ICD-10-CM | POA: Insufficient documentation

## 2017-06-13 DIAGNOSIS — Y839 Surgical procedure, unspecified as the cause of abnormal reaction of the patient, or of later complication, without mention of misadventure at the time of the procedure: Secondary | ICD-10-CM | POA: Diagnosis not present

## 2017-06-13 DIAGNOSIS — L98498 Non-pressure chronic ulcer of skin of other sites with other specified severity: Secondary | ICD-10-CM | POA: Diagnosis not present

## 2017-06-13 DIAGNOSIS — T8189XA Other complications of procedures, not elsewhere classified, initial encounter: Secondary | ICD-10-CM | POA: Diagnosis not present

## 2017-06-15 NOTE — Progress Notes (Signed)
[name], [name] ([id number]) Visit Report for 06/13/2017 Chief Complaint Document Details Patient Name: [name], [name] 06/13/2017 1:30 Date of Service: PM Medical Record [id number] Number: Patient Account Number: [id number] Date of Birth/Sex: 1961-05-24 (56 y.o. Female) Treating RN: [name], Other Clinician: Primary Care Provider: Keturah Adams, Provider/Extender: [name] Referring Provider: Marlena Adams in Treatment: 1 Information Obtained from: Patient Chief Complaint 06/05/17; patient is here for a nonhealing wound on her surgical scar in her occiput Electronic Signature(s) Signed: 06/13/2017 6:07:57 PM By: Meredith Keeler PA-C Entered By: [name] on 06/13/2017 13:57:19 [name] ([id number]) -------------------------------------------------------------------------------- HPI Details Patient Name: [name], [name]. 06/13/2017 1:30 Date of Service: PM Medical Record [id number] Number: Patient Account Number: [id number] Date of Birth/Sex: 1961-01-17 (56 y.o. Female) Treating RN: [name], Other Clinician: Primary Care Provider: Keturah Adams, Provider/Extender: [name] Referring Provider: Marlena Adams in Treatment: 1 History of Present Illness HPI Description: 06/05/17; this is a 56 year old woman who had actually been called about 2 weeks ago by her neurosurgeon Dr. Lacinda Axon. The story was that she had had a reasonably uneventful and successful meningioma resection in February of this year. She did well postoperatively and the substantial "L-shaped" wound on the scalp closed nicely. The patient tells me in some time in early April she developed a stinging sensation and there was apparently an open wound present. She saw Dr. Lacinda Axon who became concerned about an underlying infection. She hadn't a repeat MRI of the brain that apparently showed good postoperative surgical outcome without  other issues. She then went on to have 2 different courses of Keflex as well as Hibiclens to the area without final success in getting this to heal. She is here for our review of this. She states that it will close over and then once again she gets the same I uncomfortable stinging sensation that results in nonhealing. 06/13/17 on evaluation today patient continues to have an opening on her occiput region which has not really responded greatly to the collagen dressing over the last week. This has been a wound since surgery February 2018. Fortunately she has no erythema noted surrounding the wound No fevers, chills, nausea, or vomiting noted at this time. However she does have some tunneling towards the 12 o'clock location looking at her head on that I believe is collecting fluid and causing difficulty with this wound healing. She has discomfort ready to be a 2-3 out of 10 though not severe. Electronic Signature(s) Signed: 06/13/2017 6:07:57 PM By: Meredith Keeler PA-C Entered By: [name] on 06/13/2017 14:42:27 [name], [name] ([id number]) -------------------------------------------------------------------------------- Physical Exam Details Patient Name: [name], [name] 06/13/2017 1:30 Date of Service: PM Medical Record [id number] Number: Patient Account Number: [id number] Date of Birth/Sex: [birth year] (56 y.o. Female) Treating RN: [name], Other Clinician: Primary Care Provider: Sheppard Coil Treating STONE III, Parks Ranger, Provider/Extender: [name] Referring Provider: Marlena Adams in Treatment: 1 Constitutional Well-nourished and well-hydrated in no acute distress. Respiratory normal breathing without difficulty. clear to auscultation bilaterally. Cardiovascular regular rate and rhythm with normal S1, S2. Psychiatric this patient is able to make decisions and demonstrates good insight into disease process. Alert and Oriented x 3. pleasant and  cooperative. Notes No erythema surrounding the wound and is tolerating dressing changes well. No debridement was required although again facing her as far as orientation of the wound is concerned at the 3 o'clock location the wound edge is somewhat rolled under and may eventually need to  be debrided in order to appropriately. With that being said for the focus on the tunnel region to see if we can help resolve that the fact that she keeps collecting fluid in this wound. Patient was in agreement with the plan. No debridement was performed during her evaluation today. Electronic Signature(s) Signed: 06/13/2017 6:07:57 PM By: Meredith Keeler PA-C Entered By: [name] on 06/13/2017 14:43:28 [name], [name] ([id number]) -------------------------------------------------------------------------------- Physician Orders Details Patient Name: [name], [name] 06/13/2017 1:30 Date of Service: PM Medical Record [id number] Number: Patient Account Number: [id number] Date of Birth/Sex: [birth year] (56 y.o. 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 Skin Barriers/Peri-Wound Care Wound #1 Right Head - Parietal o Skin Prep Primary Wound Dressing Wound #1 Right Head - Parietal o Iodoform packing Gauze - 1/4" - cut in half Secondary Dressing Wound #1 Right Head - Parietal o Other - Coverlet Dressing Change Frequency Wound #1 Right Head - Parietal o Change dressing every  day. Follow-up Appointments Wound #1 Right Head - Parietal o Return Appointment in 1 week. Notes [name], [name] ([id number]) We will switch the Current wound care measures to packing her wound with Iodoform gauze per the above wound care specifics. We will see were things stand in one week with this dressing order. Fortunately patient's daughter is a Designer, jewellery with the [facility] and will be helping her with the dressing changes at home. If anything worsens in the interim patient will contact our office for additional recommendations. Otherwise we will see her for reevaluation in one week. Electronic Signature(s) Signed: 06/13/2017 6:07:57 PM By: Meredith Keeler PA-C Entered By: [name] on 06/13/2017 14:44:46 Krone, Bettymae Jerilynn Adams ([id number]) -------------------------------------------------------------------------------- Problem List Details Patient Name: [name], [name] 06/13/2017 1:30 Date of Service: PM Medical Record [id number] Number: Patient Account Number: [id number] Date of Birth/Sex: 10-Apr-1961 (56 y.o. Female) Treating RN: [name], Other Clinician: Primary Care Provider: Keturah Adams, Provider/Extender: [name] Referring Provider: Marlena Adams in Treatment: 1 Active Problems ICD-10 Encounter Code Description Active Date Diagnosis T81.31XD Disruption of external operation (surgical) wound, not 06/05/2017 Yes elsewhere classified, subsequent encounter L98.498 Non-pressure chronic ulcer of other sites with other 06/05/2017 Yes specified severity Inactive Problems Resolved Problems Electronic Signature(s) Signed: 06/13/2017 6:07:57 PM By: Meredith Keeler PA-C Entered By: [name] on 06/13/2017 13:57:05 Portnoy, Laurianne Jerilynn Adams ([id number]) -------------------------------------------------------------------------------- Progress Note Details Patient Name: Meredith Perches. 06/13/2017 1:30 Date of  Service: PM Medical Record [id number] Number: Patient Account Number: [id number] Date of Birth/Sex: [birth year] (56 y.o. Female) Treating RN: [name], Other Clinician: Primary Care Provider: Keturah Adams, Provider/Extender: [name] Referring Provider: Marlena Adams in Treatment: 1 Subjective Chief Complaint Information obtained from Patient 06/05/17; patient is here for a nonhealing wound on her surgical scar in her occiput History of Present Illness (HPI) 06/05/17; this is a 56 year old woman who had actually been called about 2 weeks ago by her neurosurgeon Dr. Lacinda Axon. The story was that she had had a reasonably uneventful and successful meningioma resection in February of this year. She did well postoperatively and the  substantial "L-shaped" wound on the scalp closed nicely. The patient tells me in some time in early April she developed a stinging sensation and there was apparently an open wound present. She saw Dr. Lacinda Axon who became concerned about an underlying infection. She hadn't a repeat MRI of the brain that apparently showed good postoperative surgical outcome without other issues. She then went on to have 2 different courses of Keflex as well as Hibiclens to the area without final success in getting this to heal. She is here for our review of this. She states that it will close over and then once again she gets the same I uncomfortable stinging sensation that results in nonhealing. 06/13/17 on evaluation today patient continues to have an opening on her occiput region which has not really responded greatly to the collagen dressing over the last week. This has been a wound since surgery February 2018. Fortunately she has no erythema noted surrounding the wound No fevers, chills, nausea, or vomiting noted at this time. However she does have some tunneling towards the 12 o'clock location looking at her head on that I believe is collecting  fluid and causing difficulty with this wound healing. She has discomfort ready to be a 2-3 out of 10 though not severe. Objective Constitutional Well-nourished and well-hydrated in no acute distress. DAJANAE, BROPHY. ([id number]) Vitals Time Taken: 1:37 PM, Height: 63 in, Weight: 182 lbs, BMI: 32.2, Temperature: 98.3 F, Pulse: 72 bpm, Respiratory Rate: 16 breaths/min, Blood Pressure: 127/55 mmHg. Respiratory normal breathing without difficulty. clear to auscultation bilaterally. Cardiovascular regular rate and rhythm with normal S1, S2. Psychiatric this patient is able to make decisions and demonstrates good insight into disease process. Alert and Oriented x 3. pleasant and cooperative. General Notes: No erythema surrounding the wound and is tolerating dressing changes well. No debridement was required although again facing her as far as orientation of the wound is concerned at the 3 o'clock location the wound edge is somewhat rolled under and may eventually need to be debrided in order to appropriately. With that being said for the focus on the tunnel region to see if we can help resolve that the fact that she keeps collecting fluid in this wound. Patient was in agreement with the plan. No debridement was performed during her evaluation today. Integumentary (Hair, Skin) Wound #1 status is Open. Original cause of wound was Surgical Injury. The wound is located on the Right Head - Parietal. The wound measures 0.4cm length x 0.1cm width x 0.1cm depth; 0.031cm^2 area and 0.003cm^3 volume. The wound is limited to skin breakdown. There is no tunneling or undermining noted. There is a large amount of serosanguineous drainage noted. The wound margin is flat and intact. There is large (67-100%) red granulation within the wound bed. There is no necrotic tissue within the wound bed. 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 ([id number]) Clean wound with Normal Saline. Skin Barriers/Peri-Wound Care: Wound #1 Right Head - Parietal: Skin Prep Primary Wound Dressing: Wound #1 Right Head - Parietal: Iodoform packing Gauze - 1/4" - cut in half Secondary Dressing: Wound #1 Right Head - Parietal: Other - Coverlet Dressing Change Frequency: Wound #1 Right Head - Parietal: Change dressing every day. Follow-up Appointments: Wound #1 Right Head - Parietal: Return Appointment in 1 week. General  Notes: We will switch the Current wound care measures to packing her wound with Iodoform gauze per the above wound care specifics. We will see were things stand in one week with this dressing order. Fortunately patient's daughter is a Designer, jewellery with the [facility] [facility] health center and will be helping her with the dressing changes at home. If anything worsens in the interim patient will contact our office for additional recommendations. Otherwise we will see her for reevaluation in one week. Electronic Signature(s) Signed: 06/13/2017 6:07:57 PM By: Meredith Keeler PA-C Entered By: [name] on 06/13/2017 14:44:55 [name], [name] Kitchen ([id number]) -------------------------------------------------------------------------------- SuperBill Details Patient Name: [name], VANDERHOFF. Date of Service: 06/13/2017 Medical Record Patient Account Number: [id number] Number: Treating RN: Montey Hora Date of Birth/Sex: 12-19-1960 (56 y.o. 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 Facility Procedures CPT4 Code: [id number] Description: 78675 - WOUND CARE VISIT-LEV 2 EST PT Modifier: Quantity: 1 Physician Procedures CPT4: Description Modifier Quantity Code 4492010 [id number] - WC PHYS LEVEL 3 - EST PT 1 ICD-10 Description Diagnosis T81.31XD Disruption of external operation (surgical) wound, not elsewhere classified, subsequent encounter L98.498 Non-pressure chronic ulcer  of other sites with other specified severity Electronic Signature(s) Signed: 06/13/2017 5:00:30 PM By: Montey Hora Signed: 06/13/2017 6:07:57 PM By: Meredith Keeler PA-C Entered By: Montey Hora on 06/13/2017 17:00:29

## 2017-06-20 ENCOUNTER — Ambulatory Visit: Payer: BLUE CROSS/BLUE SHIELD

## 2017-06-21 ENCOUNTER — Ambulatory Visit: Payer: BLUE CROSS/BLUE SHIELD | Admitting: Physician Assistant

## 2017-06-21 DIAGNOSIS — R7309 Other abnormal glucose: Secondary | ICD-10-CM | POA: Diagnosis not present

## 2017-06-21 DIAGNOSIS — E782 Mixed hyperlipidemia: Secondary | ICD-10-CM | POA: Diagnosis not present

## 2017-06-22 ENCOUNTER — Encounter: Payer: BLUE CROSS/BLUE SHIELD | Admitting: Physician Assistant

## 2017-06-22 ENCOUNTER — Other Ambulatory Visit: Payer: BLUE CROSS/BLUE SHIELD

## 2017-06-22 DIAGNOSIS — Y839 Surgical procedure, unspecified as the cause of abnormal reaction of the patient, or of later complication, without mention of misadventure at the time of the procedure: Secondary | ICD-10-CM | POA: Diagnosis not present

## 2017-06-22 DIAGNOSIS — E782 Mixed hyperlipidemia: Secondary | ICD-10-CM

## 2017-06-22 DIAGNOSIS — T8131XD Disruption of external operation (surgical) wound, not elsewhere classified, subsequent encounter: Secondary | ICD-10-CM | POA: Diagnosis not present

## 2017-06-22 DIAGNOSIS — R7309 Other abnormal glucose: Secondary | ICD-10-CM

## 2017-06-22 DIAGNOSIS — T8189XA Other complications of procedures, not elsewhere classified, initial encounter: Secondary | ICD-10-CM | POA: Diagnosis not present

## 2017-06-22 DIAGNOSIS — L98498 Non-pressure chronic ulcer of skin of other sites with other specified severity: Secondary | ICD-10-CM | POA: Diagnosis not present

## 2017-06-23 LAB — LIPID PANEL
CHOL/HDL RATIO: 4 ratio (ref <5.0)
Cholesterol: 238 mg/dL — ABNORMAL HIGH (ref <200)
HDL: 60 mg/dL (ref >50)
LDL Cholesterol: 165 mg/dL — ABNORMAL HIGH (ref <100)
TRIGLYCERIDES: 64 mg/dL (ref <150)
VLDL: 13 mg/dL (ref <30)

## 2017-06-23 LAB — HEMOGLOBIN A1C
HEMOGLOBIN A1C: 5.5 % (ref <5.7)
Mean Plasma Glucose: 111 mg/dL

## 2017-06-24 NOTE — Progress Notes (Signed)
[name] ([id number]) Visit Report for 06/22/2017 Chief Complaint Document Details Patient Name: [name], [name] 06/22/2017 11:00 Date of Service: AM Medical Record [id number] Number: Patient Account Number: [id number] Date of Birth/Sex: 06-06-1961 (56 y.o. Female) Treating RN: Dorthy, [name], Other Clinician: Primary Care Provider: Sheppard Adams Treating [name], Provider/Extender: Referring Provider: Marlena Adams in Treatment: 2 Information Obtained from: Patient Chief Complaint 06/05/17; patient is here for a nonhealing wound on her surgical scar in her occiput Electronic Signature(s) Signed: 06/22/2017 3:50:36 PM By: Worthy Keeler PA-C Entered By: Worthy Keeler on 06/22/2017 11:34:25 Gaylord, [name] ([id number]) -------------------------------------------------------------------------------- HPI Details Patient Name: [name], [name] 06/22/2017 11:00 Date of Service: AM Medical Record [id number] Number: Patient Account Number: [id number] Date of Birth/Sex: 02-26-1961 (56 y.o. Female) Treating RN: Dorthy, [name], Other Clinician: Primary Care Provider: Sheppard Adams Treating [name], Provider/Extender: Referring Provider: Marlena Adams in Treatment: 2 History of Present Illness HPI Description: 06/05/17; this is a 56 year old woman who had actually been called about 2 Adams ago by her neurosurgeon Dr. Lacinda Adams. The story was that she had had a reasonably uneventful and successful meningioma resection in February of this year. She did well postoperatively and the substantial "L-shaped" wound on the scalp closed nicely. The patient tells me in some time in early April she developed a stinging sensation and there was apparently an open wound present. She saw Dr. Lacinda Adams who became concerned about an underlying infection. She hadn't a repeat MRI of the brain that apparently showed good postoperative surgical outcome  without other issues. She then went on to have 2 different courses of Keflex as well as Hibiclens to the area without final success in getting this to heal. She is here for our review of this. She states that it will close over and then once again she gets the same I uncomfortable stinging sensation that results in nonhealing. 06/13/17 on evaluation today patient continues to have an opening on her occiput region which has not really responded greatly to the collagen dressing over the last week. This has been a wound since surgery February 2018. Fortunately she has no erythema noted surrounding the wound No fevers, chills, nausea, or vomiting noted at this time. However she does have some tunneling towards the 12 o'clock location looking at her head on that I believe is collecting fluid and causing difficulty with this wound healing. She has discomfort ready to be a 2-3 out of 10 though not severe. 06/22/17 on evaluation today patient's wound on the upper portion of her head appears to be doing a little better. There is not as much erythema noted at this point in time. She has been having her family perform the dressing changes as previously recommended with the Iodoform Gauls and this has been doing very well. With that being said she does tell me that her pain is much improved compared to previous evaluation and in fact that is one of the best findings that she has noted thus far. No fevers, chills, nausea, or vomiting noted at this time. Electronic Signature(s) Signed: 06/22/2017 3:50:36 PM By: Worthy Keeler PA-C Entered By: Worthy Keeler on 06/22/2017 11:34:55 [name], [name] ([id number]) -------------------------------------------------------------------------------- Physical Exam Details Patient Name: [name], [name] 06/22/2017 11:00 Date of Service: AM Medical Record [id number] Number: Patient Account Number: [id number] Date of Birth/Sex: Jun 24, 1961 (56 y.o. Female) Treating RN:  Dorthy, [name], Other Clinician: Primary Care Provider: Sheppard Adams Treating [name], Provider/Extender: Referring Provider:  ALEXANDER Adams in Treatment: 2 Constitutional Well-nourished and well-hydrated in no acute distress. Respiratory normal breathing without difficulty. clear to auscultation bilaterally. Cardiovascular regular rate and rhythm with normal S1, S2. Psychiatric this patient is able to make decisions and demonstrates good insight into disease process. Alert and Oriented x 3. pleasant and cooperative. Notes Patient's wound still has tunneling into the 12 o'clock location when facing the patient. This is slightly better than last week's evaluation. There is no significant bleeding and definitely no purulent discharge noted at this point. Electronic Signature(s) Signed: 06/22/2017 3:50:36 PM By: Worthy Keeler PA-C Entered By: Worthy Keeler on 06/22/2017 11:35:11 [name], [name] ([id number]) -------------------------------------------------------------------------------- Physician Orders Details Patient Name: [name]. 06/22/2017 11:00 Date of Service: AM Medical Record [id number] Number: Patient Account Number: [id number] Date of Birth/Sex: Jul 05, 1961 (56 y.o. 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 Skin Barriers/Peri-Wound Care Wound #1 Right Head - Parietal o Skin Prep Primary Wound Dressing Wound #1  Right Head - Parietal o Iodoform packing Gauze - 1/4" - cut in half Secondary Dressing Wound #1 Right Head - Parietal o Other - Coverlet Dressing Change Frequency Wound #1 Right Head - Parietal o Change dressing every day. Follow-up Appointments Wound #1 Right Head - Parietal o Return Appointment in 1 week. Electronic Signature(s) ARIZA, EVANS ([id number]) Signed: 06/22/2017 3:50:36 PM By: Worthy Keeler PA-C Signed: 06/22/2017 4:31:27 PM By: Montey Hora Entered By: Montey Hora on 06/22/2017 11:15:59 [name], [name] ([id number]) -------------------------------------------------------------------------------- Problem List Details Patient Name: [name], [name]. 06/22/2017 11:00 Date of Service: AM Medical Record [id number] Number: Patient Account Number: [id number] Date of Birth/Sex: [birth year] (56 y.o. Female) Treating RN: Dorthy, [name], Other Clinician: Primary Care Provider: Sheppard Adams Treating [name], Provider/Extender: Referring Provider: Marlena Adams in Treatment: 2 Active Problems ICD-10 Encounter Code Description Active Date Diagnosis T81.31XD Disruption of external operation (surgical) wound, not 06/05/2017 Yes elsewhere classified, subsequent encounter L98.498 Non-pressure chronic ulcer of other sites with other 06/05/2017 Yes specified severity Inactive Problems Resolved Problems Electronic Signature(s) Signed: 06/22/2017 3:50:36 PM By: Worthy Keeler PA-C Entered By: Worthy Keeler on 06/22/2017 11:34:17 Amundson, [name] ([id number]) -------------------------------------------------------------------------------- Progress Note Details Patient Name: Meredith Perches. 06/22/2017 11:00 Date of Service: AM Medical Record [id number] Number: Patient Account Number: [id number] Date of Birth/Sex: 1961/04/25 (56 y.o. Female) Treating RN: Dorthy, [name], Other Clinician: Primary Care Provider: Sheppard Adams  Treating [name], Provider/Extender: Referring Provider: Marlena Adams in Treatment: 2 Subjective Chief Complaint Information obtained from Patient 06/05/17; patient is here for a nonhealing wound on her surgical scar in her occiput History of Present Illness (HPI) 06/05/17; this is a 56 year old woman who had actually been called about 2 Adams ago by her neurosurgeon Dr. Lacinda Adams. The story was that she had had a reasonably uneventful and successful meningioma resection in February of this year. She did well postoperatively and the substantial "L-shaped" wound on the scalp closed nicely. The patient tells me in some time in early April she developed a stinging sensation and there was apparently an open wound present. She saw Dr. Lacinda Adams  who became concerned about an underlying infection. She hadn't a repeat MRI of the brain that apparently showed good postoperative surgical outcome without other issues. She then went on to have 2 different courses of Keflex as well as Hibiclens to the area without final success in getting this to heal. She is here for our review of this. She states that it will close over and then once again she gets the same I uncomfortable stinging sensation that results in nonhealing. 06/13/17 on evaluation today patient continues to have an opening on her occiput region which has not really responded greatly to the collagen dressing over the last week. This has been a wound since surgery February 2018. Fortunately she has no erythema noted surrounding the wound No fevers, chills, nausea, or vomiting noted at this time. However she does have some tunneling towards the 12 o'clock location looking at her head on that I believe is collecting fluid and causing difficulty with this wound healing. She has discomfort ready to be a 2-3 out of 10 though not severe. 06/22/17 on evaluation today patient's wound on the upper portion of her head appears to be doing a  little better. There is not as much erythema noted at this point in time. She has been having her family perform the dressing changes as previously recommended with the Iodoform Gauls and this has been doing very well. With that being said she does tell me that her pain is much improved compared to previous evaluation and in fact that is one of the best findings that she has noted thus far. No fevers, chills, nausea, or vomiting noted at this time. [name], CALLAS. ([id number]) Objective Constitutional Well-nourished and well-hydrated in no acute distress. Vitals Time Taken: 11:01 AM, Height: 63 in, Source: Measured, Weight: 182 lbs, Source: Measured, BMI: 32.2, Temperature: 99.5 F, Pulse: 61 bpm, Respiratory Rate: 16 breaths/min, Blood Pressure: 147/61 mmHg. Respiratory normal breathing without difficulty. clear to auscultation bilaterally. Cardiovascular regular rate and rhythm with normal S1, S2. Psychiatric this patient is able to make decisions and demonstrates good insight into disease process. Alert and Oriented x 3. pleasant and cooperative. General Notes: Patient's wound still has tunneling into the 12 o'clock location when facing the patient. This is slightly better than last week's evaluation. There is no significant bleeding and definitely no purulent discharge noted at this point. Integumentary (Hair, Skin) Wound #1 status is Open. Original cause of wound was Surgical Injury. The wound is located on the Right Head - Parietal. The wound measures 0.4cm length x 0.2cm width x 0.7cm depth; 0.063cm^2 area and 0.044cm^3 volume. The wound is limited to skin breakdown. There is no tunneling or undermining noted. There is a large amount of serosanguineous drainage noted. The wound margin is flat and intact. There is large (67-100%) red granulation within the wound bed. There is no necrotic tissue within the wound bed. 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 ([id number]) Plan Wound Cleansing: Wound #1 Right Head - Parietal: Clean wound with Normal Saline. Skin Barriers/Peri-Wound Care: Wound #1 Right Head - Parietal: Skin Prep Primary Wound Dressing: Wound #1 Right Head - Parietal: Iodoform packing Gauze - 1/4" - cut in half Secondary Dressing: Wound #1 Right Head - Parietal: Other - Coverlet Dressing Change Frequency: Wound #1 Right Head - Parietal: Change dressing every day. Follow-up Appointments: Wound #1  Right Head - Parietal: Return Appointment in 1 week. I'm gonna recommend that we continue with the Current wound care measures for the next week. We will see her for reevaluation at that time to see were things stand. If anything worsened significantly in the meantime she will contact our office for additional recommendations. Otherwise we will hopefully note greater improvement next week especially. Electronic Signature(s) Signed: 06/22/2017 3:50:36 PM By: Worthy Keeler PA-C Entered By: Worthy Keeler on 06/22/2017 11:35:43 [name], [name] ([id number]) -------------------------------------------------------------------------------- SuperBill Details Patient Name: [name], [name]. Date of Service: 06/22/2017 Medical Record Patient Account Number: [id number] Number: Treating RN: Montey Hora Date of Birth/Sex: [birth year] (56 y.o. Female) Other Clinician: Parks Ranger, Treating Meredith III, Primary Care Provider: Sheppard Adams Provider/Extender: Corrie Adams, Adams in Treatment: 2 Referring Provider: Sheppard Adams Diagnosis Coding ICD-10 Codes Code Description Disruption of external operation (surgical) wound, not elsewhere classified, subsequent T81.31XD encounter L98.498 Non-pressure chronic ulcer of other sites with other specified  severity Facility Procedures CPT4 Code: [id number] Description: 34621 - WOUND CARE VISIT-LEV 2 EST PT Modifier: Quantity: 1 Physician Procedures CPT4: Description Modifier Quantity Code [id number] - WC PHYS LEVEL 3 - EST PT 1 ICD-10 Description Diagnosis T81.31XD Disruption of external operation (surgical) wound, not elsewhere classified, subsequent encounter L98.498 Non-pressure chronic ulcer  of other sites with other specified severity Electronic Signature(s) Signed: 06/22/2017 1:18:41 PM By: Montey Hora Signed: 06/22/2017 3:50:36 PM By: Worthy Keeler PA-C Entered By: Montey Hora on 06/22/2017 13:18:40

## 2017-06-25 NOTE — Progress Notes (Signed)
[name], [name] ([id number]) Visit Report for 06/22/2017 Arrival Information Details Patient Name: [name], [name] 06/22/2017 11:00 Date of Service: AM Medical Record [id number] Number: Patient Account Number: [id number] Date of Birth/Sex: 06-25-1961 (56 y.o. Female) Treating RN: [name], Other Clinician: Primary Care [name]: [name] Treating [name], [name] [name]: [name] in Treatment: 2 Visit Information History Since Last Visit Added or deleted any medications: No Patient Arrived: Ambulatory Any new allergies or adverse reactions: No Arrival Time: 10:59 Had a fall or experienced change in No Accompanied By: dtr activities of daily living that may affect Transfer Assistance: None risk of falls: Patient Identification Verified: Yes Signs or symptoms of abuse/neglect since last No Secondary Verification Process Yes visito Completed: Hospitalized since last visit: No Patient Requires Transmission-Based No Has Dressing in Place as Prescribed: Yes Precautions: Pain Present Now: No Patient Has Alerts: No Electronic Signature(s) Signed: 06/22/2017 4:31:27 PM By: [name] Entered By: [name] on 06/22/2017 10:59:38 Adams, Meredith Reading ([id number]) -------------------------------------------------------------------------------- Clinic Level of Care Assessment Details Patient Name: [name]. 06/22/2017 11:00 Date of Service: AM Medical Record [id number] Number: Patient Account Number: [id number] Date of Birth/Sex: Jan 02, 1961 (56 y.o. Female) Treating RN: [name], Other Clinician: Primary Care [name]: [name] Treating [name], [name] [name]: [name] in Treatment: 2 Clinic Level of Care Assessment Items TOOL 4 Quantity Score []  - Use when only an EandM is performed on FOLLOW-UP visit 0 ASSESSMENTS - Nursing  Assessment / Reassessment X - Reassessment of Co-morbidities (includes updates in patient status) 1 10 X - Reassessment of Adherence to Treatment Plan 1 5 ASSESSMENTS - Wound and Skin Assessment / Reassessment X - Simple Wound Assessment / Reassessment - one wound 1 5 []  - Complex Wound Assessment / Reassessment - multiple wounds 0 []  - Dermatologic / Skin Assessment (not related to wound area) 0 ASSESSMENTS - Focused Assessment []  - Circumferential Edema Measurements - multi extremities 0 []  - Nutritional Assessment / Counseling / Intervention 0 []  - Lower Extremity Assessment (monofilament, tuning fork, pulses) 0 []  - Peripheral Arterial Disease Assessment (using hand held doppler) 0 ASSESSMENTS - Ostomy and/or Continence Assessment and Care []  - Incontinence Assessment and Management 0 []  - Ostomy Care Assessment and Management (repouching, etc.) 0 PROCESS - Coordination of Care X - Simple Patient / Family Education for ongoing care 1 15 []  - Complex (extensive) Patient / Family Education for ongoing care 0 []  - Staff obtains Consents, Records, Test Results / Process Orders 0 [name] ([id number]) []  - Staff telephones HHA, Nursing Homes / Clarify orders / etc 0 []  - Routine Transfer to another Facility (non-emergent condition) 0 []  - Routine Hospital Admission (non-emergent condition) 0 []  - New Admissions / Biomedical engineer / Ordering NPWT, Apligraf, etc. 0 []  - Emergency Hospital Admission (emergent condition) 0 X - Simple Discharge Coordination 1 10 []  - Complex (extensive) Discharge Coordination 0 PROCESS - Special Needs []  - Pediatric / Minor Patient Management 0 []  - Isolation Patient Management 0 []  - Hearing / Language / Visual special needs 0 []  - Assessment of Community assistance (transportation, D/C planning, etc.) 0 []  - Additional assistance / Altered mentation 0 []  - Support Surface(s) Assessment (bed, cushion, seat, etc.) 0 INTERVENTIONS - Wound  Cleansing / Measurement X - Simple Wound Cleansing - one wound 1 5 []  - Complex Wound Cleansing - multiple wounds 0 X - Wound Imaging (photographs - any number of wounds) 1 5 []  -  Wound Tracing (instead of photographs) 0 X - Simple Wound Measurement - one wound 1 5 []  - Complex Wound Measurement - multiple wounds 0 INTERVENTIONS - Wound Dressings X - Small Wound Dressing one or multiple wounds 1 10 []  - Medium Wound Dressing one or multiple wounds 0 []  - Large Wound Dressing one or multiple wounds 0 []  - Application of Medications - topical 0 []  - Application of Medications - injection 0 [name], OBI. ([id number]) INTERVENTIONS - Miscellaneous []  - External ear exam 0 []  - Specimen Collection (cultures, biopsies, blood, body fluids, etc.) 0 []  - Specimen(s) / Culture(s) sent or taken to Lab for analysis 0 []  - Patient Transfer (multiple staff / Harrel Lemon Lift / Similar devices) 0 []  - Simple Staple / Suture removal (25 or less) 0 []  - Complex Staple / Suture removal (26 or more) 0 []  - Hypo / Hyperglycemic Management (close monitor of Blood Glucose) 0 []  - Ankle / Brachial Index (ABI) - do not check if billed separately 0 X - Vital Signs 1 5 Has the patient been seen at the hospital within the last three years: Yes Total Score: 75 Level Of Care: New/Established - Level 2 Electronic Signature(s) Signed: 06/22/2017 4:31:27 PM By: [name] Entered By: [name] on 06/22/2017 13:18:34 [name] ([id number]) -------------------------------------------------------------------------------- Encounter Discharge Information Details Patient Name: [name]. 06/22/2017 11:00 Date of Service: AM Medical Record [id number] Number: Patient Account Number: [id number] Date of Birth/Sex: [birth year] (56 y.o. Female) Treating RN: [name], Other Clinician: Primary Care [name]: [name] Treating [name], Debora Stockdale/Extender: Adams  [name]: [name] in Treatment: 2 Encounter Discharge Information Items Discharge Pain Level: 0 Discharge Condition: Stable Ambulatory Status: Ambulatory Discharge Destination: Home Transportation: Private Auto Accompanied By: dtr Schedule Follow-up Appointment: Yes Medication Reconciliation completed and provided to Patient/Care No Wael Maestas: Provided on Clinical Summary of Care: 06/22/2017 Form Type Recipient Paper Patient DP Electronic Signature(s) Signed: 06/25/2017 8:55:24 AM By: Ruthine Dose Entered By: Ruthine Dose on 06/22/2017 11:29:45 Vilchis, Breeonna Jerilynn Mages ([id number]) -------------------------------------------------------------------------------- Multi Wound Chart Details Patient Name: LEEANNE, BUTTERS. 06/22/2017 11:00 Date of Service: AM Medical Record [id number] Number: Patient Account Number: [id number] Date of Birth/Sex: 10-Apr-1961 (56 y.o. Female) Treating RN: [name], Other Clinician: Primary Care Loyal Holzheimer: [name] Treating [name], Vinayak Bobier/Extender: Adams Teryl Gubler: [name] in Treatment: 2 Vital Signs Height(in): 63 Pulse(bpm): 61 Weight(lbs): 182 Blood Pressure 147/61 (mmHg): Body Mass Index(BMI): 32 Temperature(F): 99.5 Respiratory Rate 16 (breaths/min): Photos: [1:No Photos] [N/A:N/A] Wound Location: [1:Right Head - Parietal] [N/A:N/A] Wounding Event: [1:Surgical Injury] [N/A:N/A] Primary Etiology: [1:Open Surgical Wound] [N/A:N/A] Comorbid History: [1:Chronic sinus problems/congestion, Hypertension] [N/A:N/A] Date Acquired: [1:12/25/2016] [N/A:N/A] Weeks of Treatment: [1:2] [N/A:N/A] Wound Status: [1:Open] [N/A:N/A] Measurements L x W x D 0.4x0.2x0.7 [N/A:N/A] (cm) Area (cm) : [1:0.063] [N/A:N/A] Volume (cm) : [1:0.044] [N/A:N/A] % Reduction in Area: [1:59.90%] [N/A:N/A] % Reduction in Volume: -175.00% [N/A:N/A] Classification: [1:Partial Thickness] [N/A:N/A] Exudate Amount:  [1:Large] [N/A:N/A] Exudate Type: [1:Serosanguineous] [N/A:N/A] Exudate Color: [1:red, brown] [N/A:N/A] Wound Margin: [1:Flat and Intact] [N/A:N/A] Granulation Amount: [1:Large (67-100%)] [N/A:N/A] Granulation Quality: [1:Red] [N/A:N/A] Necrotic Amount: [1:None Present (0%)] [N/A:N/A] Exposed Structures: [1:Fascia: No Fat Layer (Subcutaneous Tissue) Exposed: No] [N/A:N/A] Tendon: No Muscle: No Joint: No Bone: No Limited to Skin Breakdown Epithelialization: Small (1-33%) N/A N/A Periwound Skin Texture: No Abnormalities Noted N/A N/A Periwound Skin No Abnormalities Noted N/A N/A Moisture: Periwound Skin Color: No Abnormalities Noted N/A N/A Tenderness on No N/A N/A Palpation: Wound Preparation: Ulcer  Cleansing: Not N/A N/A Cleansed Topical Anesthetic Applied: Other: lidocaine 4% Treatment Notes Electronic Signature(s) Signed: 06/22/2017 4:31:27 PM By: [name] Entered By: [name] on 06/22/2017 11:13:52 [name], [name] ([id number]) -------------------------------------------------------------------------------- [place] Details Patient Name: SEBASTIAN, DZIK. 06/22/2017 11:00 Date of Service: AM Medical Record [id number] Number: Patient Account Number: [id number] Date of Birth/Sex: 10-11-1961 (56 y.o. Female) Treating RN: [name], Other Clinician: Primary Care Linden Mikes: [name] Treating [name], Thetis Schwimmer/Extender: Adams Hiroki Wint: [name] in Treatment: 2 Active Inactive ` Orientation to the Wound Care Program Nursing Diagnoses: Knowledge deficit related to the wound healing center program Goals: Patient/caregiver will verbalize understanding of the [facility] Date Initiated: 06/05/2017 Target Resolution Date: 06/05/2017 Goal Status: Active Interventions: Provide education on orientation to the wound center Notes: ` Wound/Skin Impairment Nursing  Diagnoses: Impaired tissue integrity Goals: Ulcer/skin breakdown will heal within 14 weeks Date Initiated: 06/05/2017 Target Resolution Date: 09/05/2017 Goal Status: Active Interventions: Assess patient/caregiver ability to obtain necessary supplies Assess ulceration(s) every visit Treatment Activities: Skin care regimen initiated : 06/05/2017 Topical wound management initiated : 06/05/2017 [name], [name] ([id number]) Notes: Electronic Signature(s) Signed: 06/22/2017 4:31:27 PM By: [name] Entered By: [name] on 06/22/2017 11:13:44 Rightmyer, Meredith Reading ([id number]) -------------------------------------------------------------------------------- Pain Assessment Details Patient Name: [name], [name]. 06/22/2017 11:00 Date of Service: AM Medical Record [id number] Number: Patient Account Number: [id number] Date of Birth/Sex: [birth year] (56 y.o. Female) Treating RN: [name], Other Clinician: Primary Care Wilmary Levit: [name] Treating [name], Tommy Goostree/Extender: Adams Jordin Vicencio: [name] in Treatment: 2 Active Problems Location of Pain Severity and Description of Pain Patient Has Paino No Site Locations Pain Management and Medication Current Pain Management: Notes Topical or injectable lidocaine is offered to patient for acute pain when surgical debridement is performed. If needed, Patient is instructed to use over the counter pain medication for the following 24-48 hours after debridement. Wound care MDs do not prescribed pain medications. Patient has chronic pain or uncontrolled pain. Patient has been instructed to make an appointment with their Primary Care Physician for pain management. Electronic Signature(s) Signed: 06/22/2017 4:31:27 PM By: [name] Entered By: [name] on 06/22/2017 10:59:49 [name]  ([id number]) -------------------------------------------------------------------------------- Patient/Caregiver Education Details Patient Name: [name], MANKA. 06/22/2017 11:00 Date of Service: AM Medical Record [id number] Number: Patient Account Number: [id number] Date of Birth/Gender: October 16, 1961 (56 y.o. Female) Treating RN: [name] Prentice, Other Clinician: Physician: [name] Treating [name], Physician/Extender: Adams Physician: [name] in Treatment: 2 Education Assessment Education Provided To: Patient and Caregiver Education Topics Provided Wound/Skin Impairment: Handouts: Other: wound care as ordered Methods: Demonstration, Explain/Verbal Responses: State content correctly Electronic Signature(s) Signed: 06/22/2017 4:31:27 PM By: [name] Entered By: [name] on 06/22/2017 11:15:31 Aronov, Meredith Reading ([id number]) -------------------------------------------------------------------------------- Wound Assessment Details Patient Name: [name], [name]. 06/22/2017 11:00 Date of Service: AM Medical Record [id number] Number: Patient Account Number: [id number] Date of Birth/Sex: 10-21-1961 (56 y.o. Female) Treating RN: [name], Other Clinician: Primary Care Juandiego Kolenovic: [name] Treating [name], Christin Moline/Extender: Adams Jasmarie Coppock: [name] in Treatment: 2 Wound Status Wound Number: 1 Primary Open Surgical Wound Etiology: Wound Location: Right Head - Parietal Wound Open Wounding Event: Surgical Injury Status: Date Acquired: 12/25/2016 Comorbid Chronic sinus problems/congestion, Weeks Of Treatment: 2 History: Hypertension Clustered Wound: No Photos Photo Uploaded By: [name] on 06/22/2017 14:45:54 Wound Measurements Length: (cm) 0.4 Width: (cm) 0.2 Depth: (cm) 0.7 Area: (cm) 0.063 Volume: (cm)  0.044 % Reduction in Area: 59.9% % Reduction  in Volume: -175% Epithelialization: Small (1-33%) Tunneling: No Undermining: No Wound Description Classification: Partial Thickness Wound Margin: Flat and Intact Exudate Amount: Large Exudate Type: Serosanguineous Exudate Color: red, brown Foul Odor After Cleansing: No Slough/Fibrino No Wound Bed Granulation Amount: Large (67-100%) Exposed Structure Granulation Quality: Red Fascia Exposed: No [name], MALAN. ([id number]) Necrotic Amount: None Present (0%) Fat Layer (Subcutaneous Tissue) Exposed: No Tendon Exposed: No Muscle Exposed: No Joint Exposed: No Bone Exposed: No Limited to Skin Breakdown Periwound Skin Texture Texture Color No Abnormalities Noted: No No Abnormalities Noted: No Moisture No Abnormalities Noted: No Wound Preparation Ulcer Cleansing: Not Cleansed Topical Anesthetic Applied: Other: lidocaine 4%, Treatment Notes Wound #1 (Right Head - Parietal) 1. Cleansed with: Clean wound with Normal Saline 2. Anesthetic Topical Lidocaine 4% cream to wound bed prior to debridement 4. Dressing Applied: Iodoform packing Gauze Other dressing (specify in notes) 5. Secondary Dressing Applied Dry Gauze Notes Coverlet to secure Electronic Signature(s) Signed: 06/22/2017 4:31:27 PM By: [name] Entered By: [name] on 06/22/2017 11:05:59 [name] ([id number]) -------------------------------------------------------------------------------- Vitals Details Patient Name: [name], [name]. 06/22/2017 11:00 Date of Service: AM Medical Record [id number] Number: Patient Account Number: [id number] Date of Birth/Sex: [birth year] (56 y.o. Female) Treating RN: [name], Other Clinician: Primary Care Toy Eisemann: [name] Treating [name], Winton Offord/Extender: Adams Joclynn Lumb: [name] in Treatment: 2 Vital Signs Time Taken: 11:01 Temperature (F): 99.5 Height (in): 63 Pulse (bpm): 61 Source:  Measured Respiratory Rate (breaths/min): 16 Weight (lbs): 182 Blood Pressure (mmHg): 147/61 Source: Measured Reference Range: 80 - 120 mg / dl Body Mass Index (BMI): 32.2 Electronic Signature(s) Signed: 06/22/2017 4:31:27 PM By: [name] Entered By: [name] on 06/22/2017 11:02:19

## 2017-06-28 NOTE — Progress Notes (Signed)
Gynecology Annual Exam  PCP: Olin Hauser, DO  Chief Complaint:  Chief Complaint  Patient presents with  . Gynecologic Exam    History of Present Illness: [name] is a 56 y.o. G2 P2 WF who presents for her annual exam. The patient has no complaints today.  Her menses are absent since 2013 and she is post menopausal Last pap smear: 06/22/2015, results were NIL/negative HRHPV. There is a remote history of abnomal Pap smears and has had a LEEP in 1999.   The patient is sexually active.   Since her last visit 06/22/2015, she has had a resection of a meningioma which was causing foot drop, vertigo and and blurred vision. These symptoms have resolved. She has had some problems with her scalp wound healing and has been going to the wound center.  Her past medical history is remarkable for hypertension, osteoarthritis, and hyperlipidemia.  The patient does perform self breast exams. Her last mammogram was 06/22/2015, results were negative.   There is a family history of breast cancer in her maternal aunt Genetic testing is not indicated   There is no family history of ovarian cancer. There is a family history of colon cancer in her father.  She has had 3 colonoscopies and she believes her last colonoscopy was done 5 years ago and was normal. She has them every 5 years and is due for a colonoscopy.  The patient denies smoking.  She drinks alcohol occasionally.  She denies illegal drug use.  The patient reports exercising occasionally. She may get enough calcium in her diet and supplement. Current BMI= 33.65 kg/m2.  She had a recent cholesterol panel done that was borderline elevated.  The patient denies current symptoms of depression.    Review of Systems: Review of Systems  Constitutional: Negative for chills, fever and weight loss.  HENT: Negative for congestion, sinus pain and sore throat.   Eyes: Negative for blurred vision and pain.  Respiratory:  Negative for hemoptysis, shortness of breath and wheezing.   Cardiovascular: Negative for chest pain, palpitations and leg swelling.  Gastrointestinal: Negative for abdominal pain, blood in stool, diarrhea, heartburn, nausea and vomiting.  Genitourinary: Negative for dysuria, frequency, hematuria and urgency.  Musculoskeletal: Negative for back pain, joint pain (in hands) and myalgias.  Skin: Negative for itching and rash.  Neurological: Negative for dizziness, tingling and headaches.  Endo/Heme/Allergies: Negative for environmental allergies and polydipsia. Does not bruise/bleed easily.       Negative for hirsutism   Psychiatric/Behavioral: Negative for depression. The patient is not nervous/anxious and does not have insomnia.     Past Medical History:  Past Medical History:  Diagnosis Date  . Essential hypertension    controlled with medication;   . Family history of colon cancer    father  . Meningioma (HCC)    right side of brain  . Osteoarthritis     Past Surgical History:  Past Surgical History:  Procedure Laterality Date  . BRAIN SURGERY  11/2016   removal of meningioma  . CERVICAL BIOPSY  W/ LOOP ELECTRODE EXCISION  1999  . COLONOSCOPY    . TONSILLECTOMY AND ADENOIDECTOMY      Family History:  Family History  Problem Relation Age of Onset  . Hypertension Mother   . Stroke Mother        2012  . Hypertension Father   . Cancer - Colon Father   . Diabetes Brother   . Breast cancer Maternal Aunt  76  . Diabetes Maternal Aunt     Social History:  Social History   Social History  . Marital status: Married    Spouse name: N/A  . Number of children: 2  . Years of education: N/A   Occupational History  . Secretary/ Bookkeeper    Social History Main Topics  . Smoking status: Never Smoker  . Smokeless tobacco: Never Used  . Alcohol use No  . Drug use: No  . Sexual activity: Yes    Birth control/ protection: Post-menopausal   Other Topics Concern  . Not  on file   Social History Narrative  . No narrative on file   Obstetrical History:  OB History  Gravida Para Term Preterm AB Living  2 2 2     2   SAB TAB Ectopic Multiple Live Births          2    # Outcome Date GA Lbr Len/2nd Weight Sex Delivery Anes PTL Lv  2 Term 12/10/86   7 lb 2 oz (3.232 kg) F Vag-Spont   LIV  1 Term 12/03/82   7 lb 8 oz (3.402 kg) F Vag-Spont   LIV      Allergies:  No Known Allergies  Medications: Prior to Admission medications   Medication Sig Start Date End Date Taking? Authorizing Provider  acetaminophen (TYLENOL) 325 MG tablet Take 650 mg by mouth every 6 (six) hours as needed.    [provider]  ascorbic acid (VITAMIN C) 1000 MG tablet Take by mouth.    [provider]                docusate sodium (COLACE) 50 MG capsule Take by mouth.    [provider]  fexofenadine (ALLEGRA) 60 MG tablet Take 60 mg by mouth daily.    [provider]  Fish Oil-Cholecalciferol (FISH OIL + D3 PO) Take by mouth.    [provider]  fluticasone Asencion Islam) 50 MCG/ACT nasal spray  02/23/16   [provider]  hydrochlorothiazide (HYDRODIURIL) 25 MG tablet TAKE 1 TABLET(25 MG) BY MOUTH DAILY 07/31/16   Arlis Porta., MD  losartan (COZAAR) 100 MG tablet TAKE 1 TABLET BY MOUTH EVERY DAY 08/24/16   Arlis Porta., MD  meloxicam (MOBIC) 7.5 MG tablet TAKE 1 TABLET(7.5 MG) BY MOUTH DAILY 12/20/16   Arlis Porta., MD  Multiple Vitamin (MULTI-VITAMINS) TABS Take by mouth.    [provider]    Physical Exam Vitals: BP 138/78   Pulse (!) 55   Ht 5\' 2"  (1.575 m)   Wt 184 lb (83.5 kg)   LMP  (Exact Date)   BMI 33.65 kg/m   General: pleasant WF in NAD HEENT: normocephalic, anicteric Neck: no thyroid enlargement, no palpable nodules, no cervical lymphadenopathy  Pulmonary: No increased work of breathing, CTAB Cardiovascular: RRR, without murmur  Breast: Breast symmetrical, no tenderness, no  palpable nodules or masses, no skin or nipple retraction present, no nipple discharge.  No axillary, infraclavicular or supraclavicular lymphadenopathy. Abdomen: Soft, non-tender, non-distended.  Umbilicus without lesions.  No hepatomegaly or masses palpable. No evidence of hernia. Genitourinary:  External: Normal external female genitalia.  Normal urethral meatus, normal Bartholin's and Skene's glands.    Vagina: Normal vaginal mucosa, no evidence of prolapse.    Cervix: Grossly normal in appearance, no bleeding, non-tender  Uterus: Anteflexed, normal size, shape, and consistency, mobile, and non-tender  Adnexa: No adnexal masses, non-tender  Rectal: deferred  Lymphatic: no evidence of inguinal lymphadenopathy Extremities: no edema, erythema, or tenderness Neurologic: Grossly intact Psychiatric: mood appropriate, affect full Skin: numerous dark macular freckles on trunk/ chest/back.   Assessment: 56 y.o. postmenopausal female with normal annual gyn exam  Plan:  1) Breast cancer screening - recommend monthly self breast exam and annual screening mammogram. . Mammogram was ordered today. Patient to schedule at [facility]  2 Colon cancer screening- referral for colonoscopy with Dr Tiffany Kocher  3) Cervical cancer screening - Pap was done.   4) Routine healthcare maintenance including cholesterol and diabetes screening managed by PCP   Dalia Heading, CNM

## 2017-06-29 ENCOUNTER — Encounter: Payer: Self-pay | Admitting: Certified Nurse Midwife

## 2017-06-29 ENCOUNTER — Other Ambulatory Visit: Payer: Self-pay | Admitting: Certified Nurse Midwife

## 2017-06-29 ENCOUNTER — Ambulatory Visit (INDEPENDENT_AMBULATORY_CARE_PROVIDER_SITE_OTHER): Payer: BLUE CROSS/BLUE SHIELD | Admitting: Certified Nurse Midwife

## 2017-06-29 ENCOUNTER — Encounter: Payer: BLUE CROSS/BLUE SHIELD | Admitting: Surgery

## 2017-06-29 VITALS — BP 138/78 | HR 55 | Ht 62.0 in | Wt 184.0 lb

## 2017-06-29 DIAGNOSIS — Z1231 Encounter for screening mammogram for malignant neoplasm of breast: Secondary | ICD-10-CM | POA: Diagnosis not present

## 2017-06-29 DIAGNOSIS — Y839 Surgical procedure, unspecified as the cause of abnormal reaction of the patient, or of later complication, without mention of misadventure at the time of the procedure: Secondary | ICD-10-CM | POA: Diagnosis not present

## 2017-06-29 DIAGNOSIS — R87810 Cervical high risk human papillomavirus (HPV) DNA test positive: Secondary | ICD-10-CM | POA: Diagnosis not present

## 2017-06-29 DIAGNOSIS — Z800 Family history of malignant neoplasm of digestive organs: Secondary | ICD-10-CM | POA: Diagnosis not present

## 2017-06-29 DIAGNOSIS — T8189XD Other complications of procedures, not elsewhere classified, subsequent encounter: Secondary | ICD-10-CM | POA: Diagnosis not present

## 2017-06-29 DIAGNOSIS — T8131XD Disruption of external operation (surgical) wound, not elsewhere classified, subsequent encounter: Secondary | ICD-10-CM | POA: Diagnosis not present

## 2017-06-29 DIAGNOSIS — Z1211 Encounter for screening for malignant neoplasm of colon: Secondary | ICD-10-CM

## 2017-06-29 DIAGNOSIS — L98498 Non-pressure chronic ulcer of skin of other sites with other specified severity: Secondary | ICD-10-CM | POA: Diagnosis not present

## 2017-06-29 DIAGNOSIS — Z01419 Encounter for gynecological examination (general) (routine) without abnormal findings: Secondary | ICD-10-CM | POA: Diagnosis not present

## 2017-06-29 DIAGNOSIS — Z124 Encounter for screening for malignant neoplasm of cervix: Secondary | ICD-10-CM | POA: Diagnosis not present

## 2017-06-29 DIAGNOSIS — Z1239 Encounter for other screening for malignant neoplasm of breast: Secondary | ICD-10-CM

## 2017-07-01 ENCOUNTER — Encounter: Payer: Self-pay | Admitting: Certified Nurse Midwife

## 2017-07-03 LAB — IGP,RFX APTIMA HPV ALL PTH: PAP Smear Comment: 0

## 2017-07-03 LAB — PLEASE NOTE

## 2017-07-03 NOTE — Progress Notes (Signed)
[name] ([id number]) Visit Report for 06/29/2017 Arrival Information Details Patient Name: [name], [name] 06/29/2017 8:15 Date of Service: AM Medical Record [id number] Number: Patient Account Number: [id number] Date of Birth/Sex: 1961-02-13 (56 y.o. Female) Treating RN: Benetta Spar, Other Clinician: Primary Care Andren Bethea: Sheppard Coil Treating Britto, Ishmael Holter, Louvina Cleary/Extender: Referring Aliegha Paullin: Marlena Clipper in Treatment: 3 Visit Information History Since Last Visit Added or deleted any medications: No Patient Arrived: Ambulatory Any new allergies or adverse reactions: No Arrival Time: 08:22 Had a fall or experienced change in No Accompanied By: self activities of daily living that may affect Transfer Assistance: None risk of falls: Patient Identification Verified: Yes Signs or symptoms of abuse/neglect since last No Secondary Verification Process Yes visito Completed: Hospitalized since last visit: No Patient Requires Transmission-Based No Has Dressing in Place as Prescribed: No Precautions: Pain Present Now: No Patient Has Alerts: No Electronic Signature(s) Signed: 07/03/2017 7:55:48 AM By: Gretta Cool, BSN, RN, CWS, Kim RN, BSN Entered By: Gretta Cool, BSN, RN, CWS, Kim on 06/29/2017 08:23:00 [name] ([id number]) -------------------------------------------------------------------------------- Clinic Level of Care Assessment Details Patient Name: [name] 06/29/2017 8:15 Date of Service: AM Medical Record [id number] Number: Patient Account Number: [id number] Date of Birth/Sex: 06-18-1961 (56 y.o. Female) Treating RN: Benetta Spar, Other Clinician: Primary Care Antinio Sanderfer: Sheppard Coil Treating Britto, Ishmael Holter, Avyukth Bontempo/Extender: Referring Rehan Holness: Marlena Clipper in Treatment: 3 Clinic Level of Care Assessment Items TOOL 4 Quantity Score []  - Use when only an EandM is performed on FOLLOW-UP visit  0 ASSESSMENTS - Nursing Assessment / Reassessment []  - Reassessment of Co-morbidities (includes updates in patient status) 0 X - Reassessment of Adherence to Treatment Plan 1 5 ASSESSMENTS - Wound and Skin Assessment / Reassessment X - Simple Wound Assessment / Reassessment - one wound 1 5 []  - Complex Wound Assessment / Reassessment - multiple wounds 0 []  - Dermatologic / Skin Assessment (not related to wound area) 0 ASSESSMENTS - Focused Assessment []  - Circumferential Edema Measurements - multi extremities 0 []  - Nutritional Assessment / Counseling / Intervention 0 []  - Lower Extremity Assessment (monofilament, tuning fork, pulses) 0 []  - Peripheral Arterial Disease Assessment (using hand held doppler) 0 ASSESSMENTS - Ostomy and/or Continence Assessment and Care []  - Incontinence Assessment and Management 0 []  - Ostomy Care Assessment and Management (repouching, etc.) 0 PROCESS - Coordination of Care X - Simple Patient / Family Education for ongoing care 1 15 []  - Complex (extensive) Patient / Family Education for ongoing care 0 []  - Staff obtains Consents, Records, Test Results / Process Orders 0 [name], [name] ([id number]) []  - Staff telephones HHA, Nursing Homes / Clarify orders / etc 0 []  - Routine Transfer to another Facility (non-emergent condition) 0 []  - Routine Hospital Admission (non-emergent condition) 0 []  - New Admissions / Biomedical engineer / Ordering NPWT, Apligraf, etc. 0 []  - Emergency Hospital Admission (emergent condition) 0 X - Simple Discharge Coordination 1 10 []  - Complex (extensive) Discharge Coordination 0 PROCESS - Special Needs []  - Pediatric / Minor Patient Management 0 []  - Isolation Patient Management 0 []  - Hearing / Language / Visual special needs 0 []  - Assessment of Community assistance (transportation, D/C planning, etc.) 0 []  - Additional assistance / Altered mentation 0 []  - Support Surface(s) Assessment (bed, cushion, seat, etc.)  0 INTERVENTIONS - Wound Cleansing / Measurement X - Simple Wound Cleansing - one wound 1 5 []  - Complex Wound Cleansing - multiple wounds 0 X - Wound Imaging (photographs - any number  of wounds) 1 5 []  - Wound Tracing (instead of photographs) 0 X - Simple Wound Measurement - one wound 1 5 []  - Complex Wound Measurement - multiple wounds 0 INTERVENTIONS - Wound Dressings X - Small Wound Dressing one or multiple wounds 1 10 []  - Medium Wound Dressing one or multiple wounds 0 []  - Large Wound Dressing one or multiple wounds 0 []  - Application of Medications - topical 0 []  - Application of Medications - injection 0 [name], STMARIE. ([id number]) INTERVENTIONS - Miscellaneous []  - External ear exam 0 []  - Specimen Collection (cultures, biopsies, blood, body fluids, etc.) 0 []  - Specimen(s) / Culture(s) sent or taken to Lab for analysis 0 []  - Patient Transfer (multiple staff / Harrel Lemon Lift / Similar devices) 0 []  - Simple Staple / Suture removal (25 or less) 0 []  - Complex Staple / Suture removal (26 or more) 0 []  - Hypo / Hyperglycemic Management (close monitor of Blood Glucose) 0 []  - Ankle / Brachial Index (ABI) - do not check if billed separately 0 X - Vital Signs 1 5 Has the patient been seen at the hospital within the last three years: Yes Total Score: 65 Level Of Care: New/Established - Level 2 Electronic Signature(s) Signed: 07/03/2017 7:55:48 AM By: Gretta Cool, BSN, RN, CWS, Kim RN, BSN Entered By: Gretta Cool, BSN, RN, CWS, Kim on 06/29/2017 08:39:32 [name] ([id number]) -------------------------------------------------------------------------------- Encounter Discharge Information Details Patient Name: [name] 06/29/2017 8:15 Date of Service: AM Medical Record [id number] Number: Patient Account Number: [id number] Date of Birth/Sex: Mar 12, 1961 (56 y.o. Female) Treating RN: Benetta Spar, Other Clinician: Primary Care Climmie Buelow: Sheppard Coil Treating Britto,  Ishmael Holter, Quindon Denker/Extender: Referring Hermenegildo Clausen: Marlena Clipper in Treatment: 3 Encounter Discharge Information Items Discharge Pain Level: 0 Discharge Condition: Stable Ambulatory Status: Ambulatory Discharge Destination: Home Transportation: Private Auto Accompanied By: self Schedule Follow-up Appointment: Yes Medication Reconciliation completed and provided to Patient/Care Yes Raimundo Corbit: Provided on Clinical Summary of Care: 06/29/2017 Form Type Recipient Paper Patient DP Electronic Signature(s) Signed: 07/03/2017 7:55:48 AM By: Gretta Cool, BSN, RN, CWS, Kim RN, BSN Entered By: Gretta Cool, BSN, RN, CWS, Kim on 06/29/2017 08:40:57 [name] ([id number]) -------------------------------------------------------------------------------- Lower Extremity Assessment Details Patient Name: CARESSA, SCEARCE 06/29/2017 8:15 Date of Service: AM Medical Record [id number] Number: Patient Account Number: [id number] Date of Birth/Sex: 06/27/1961 (56 y.o. Female) Treating RN: Benetta Spar, Other Clinician: Primary Care Reyah Streeter: Sheppard Coil Treating Britto, Ishmael Holter, Shawntelle Ungar/Extender: Referring Izmael Duross: Marlena Clipper in Treatment: 3 Electronic Signature(s) Signed: 07/03/2017 7:55:48 AM By: Gretta Cool, BSN, RN, CWS, Kim RN, BSN Entered By: Gretta Cool, BSN, RN, CWS, Kim on 06/29/2017 08:28:50 [name] ([id number]) -------------------------------------------------------------------------------- Multi Wound Chart Details Patient Name: [name], [name] 06/29/2017 8:15 Date of Service: AM Medical Record [id number] Number: Patient Account Number: [id number] Date of Birth/Sex: 1961/04/18 (56 y.o. Female) Treating RN: Benetta Spar, Other Clinician: Primary Care Layton Tappan: Sheppard Coil Treating Britto, Ishmael Holter, Zyionna Pesce/Extender: Referring Donal Lynam: Marlena Clipper in Treatment: 3 Vital Signs Height(in): 63 Pulse(bpm): 67 Weight(lbs): 182 Blood  Pressure 148/65 (mmHg): Body Mass Index(BMI): 32 Temperature(F): 98.3 Respiratory Rate 16 (breaths/min): Photos: [N/A:N/A] Wound Location: Right Head - Parietal N/A N/A Wounding Event: Surgical Injury N/A N/A Primary Etiology: Open Surgical Wound N/A N/A Comorbid History: Chronic sinus N/A N/A problems/congestion, Hypertension Date Acquired: 12/25/2016 N/A N/A Weeks of Treatment: 3 N/A N/A Wound Status: Open N/A N/A Measurements L x W x D 0.4x0.2x0.6 N/A N/A (cm) Area (cm) : 0.063 N/A N/A Volume (cm) : 0.038 N/A N/A %  Reduction in Area: 59.90% N/A N/A % Reduction in Volume: -137.50% N/A N/A Starting Position 1 3 (o'clock): Ending Position 1 7 (o'clock): Maximum Distance 1 0.9 (cm): Undermining: Yes N/A N/A [name], LAI. ([id number]) Classification: Full Thickness Without N/A N/A Exposed Support Structures Exudate Amount: Large N/A N/A Exudate Type: Serosanguineous N/A N/A Exudate Color: red, brown N/A N/A Wound Margin: Flat and Intact N/A N/A Granulation Amount: Large (67-100%) N/A N/A Granulation Quality: Red N/A N/A Necrotic Amount: None Present (0%) N/A N/A Exposed Structures: Fascia: No N/A N/A Fat Layer (Subcutaneous Tissue) Exposed: No Tendon: No Muscle: No Joint: No Bone: No Limited to Skin Breakdown Epithelialization: Small (1-33%) N/A N/A Periwound Skin Texture: No Abnormalities Noted N/A N/A Periwound Skin No Abnormalities Noted N/A N/A Moisture: Periwound Skin Color: No Abnormalities Noted N/A N/A Tenderness on No N/A N/A Palpation: Wound Preparation: Ulcer Cleansing: Not N/A N/A Cleansed Topical Anesthetic Applied: None Treatment Notes Wound #1 (Right Head - Parietal) 1. Cleansed with: Clean wound with Normal Saline 4. Dressing Applied: Prisma Ag Notes Coverlet to secure Electronic Signature(s) Signed: 06/29/2017 11:54:07 AM By: Christin Fudge MD, FACS Entered By: Christin Fudge on 06/29/2017 08:42:26 [name], [name]  ([id number]) -------------------------------------------------------------------------------- [place] Details Patient Name: [name], [name] 06/29/2017 8:15 Date of Service: AM Medical Record [id number] Number: Patient Account Number: [id number] Date of Birth/Sex: 1961-09-13 (56 y.o. Female) Treating RN: Benetta Spar, Other Clinician: Primary Care Vennie Salsbury: Sheppard Coil Treating Britto, Ishmael Holter, Zak Gondek/Extender: Referring Mikeya Tomasetti: Marlena Clipper in Treatment: 3 Active Inactive ` Orientation to the Wound Care Program Nursing Diagnoses: Knowledge deficit related to the wound healing center program Goals: Patient/caregiver will verbalize understanding of the [facility] Program Date Initiated: 06/05/2017 Target Resolution Date: 06/05/2017 Goal Status: Active Interventions: Provide education on orientation to the wound center Notes: ` Wound/Skin Impairment Nursing Diagnoses: Impaired tissue integrity Goals: Ulcer/skin breakdown will heal within 14 weeks Date Initiated: 06/05/2017 Target Resolution Date: 09/05/2017 Goal Status: Active Interventions: Assess patient/caregiver ability to obtain necessary supplies Assess ulceration(s) every visit Treatment Activities: Skin care regimen initiated : 06/05/2017 Topical wound management initiated : 06/05/2017 [name], [name] ([id number]) Notes: Electronic Signature(s) Signed: 07/03/2017 7:55:48 AM By: Gretta Cool, BSN, RN, CWS, Kim RN, BSN Entered By: Gretta Cool, BSN, RN, CWS, Kim on 06/29/2017 08:31:10 [name] ([id number]) -------------------------------------------------------------------------------- Pain Assessment Details Patient Name: [name], [name] 06/29/2017 8:15 Date of Service: AM Medical Record [id number] Number: Patient Account Number: [id number] Date of Birth/Sex: [birth year] (56 y.o. Female) Treating RN: Benetta Spar, Other Clinician: Primary Care  Conni Knighton: Sheppard Coil Treating Britto, Ishmael Holter, Kenora Spayd/Extender: Referring Issis Lindseth: Marlena Clipper in Treatment: 3 Active Problems Location of Pain Severity and Description of Pain Patient Has Paino No Site Locations With Dressing Change: No Pain Management and Medication Current Pain Management: Goals for Pain Management Topical or injectable lidocaine is offered to patient for acute pain when surgical debridement is performed. If needed, Patient is instructed to use over the counter pain medication for the following 24-48 hours after debridement. Wound care MDs do not prescribed pain medications. Patient has chronic pain or uncontrolled pain. Patient has been instructed to make an appointment with their Primary Care Physician for pain management. Electronic Signature(s) Signed: 07/03/2017 7:55:48 AM By: Gretta Cool, BSN, RN, CWS, Kim RN, BSN Entered By: Gretta Cool, BSN, RN, CWS, Kim on 06/29/2017 08:23:35 [name] ([id number]) -------------------------------------------------------------------------------- Patient/Caregiver Education Details Patient Name: [name], [name] 06/29/2017 8:15 Date of Service: AM Medical Record [id number] Number: Patient Account Number: [id number] Date of Birth/Gender:  [birth year] (56 y.o. Female) Treating RN: Cornell Barman Crown, Other Clinician: Physician: Sheppard Coil Treating Britto, Ishmael Holter, Physician/Extender: Referring Physician: Marlena Clipper in Treatment: 3 Education Assessment Education Provided To: Patient Education Topics Provided Wound/Skin Impairment: Handouts: Caring for Your Ulcer Methods: Demonstration Responses: State content correctly Electronic Signature(s) Signed: 07/03/2017 7:55:48 AM By: Gretta Cool, BSN, RN, CWS, Kim RN, BSN Entered By: Gretta Cool, BSN, RN, CWS, Kim on 06/29/2017 08:41:11 [name]  ([id number]) -------------------------------------------------------------------------------- Wound Assessment Details Patient Name: [name], [name] 06/29/2017 8:15 Date of Service: AM Medical Record [id number] Number: Patient Account Number: [id number] Date of Birth/Sex: [birth year] (56 y.o. Female) Treating RN: Benetta Spar, Other Clinician: Primary Care Meade Hogeland: Sheppard Coil Treating Britto, Ishmael Holter, Darnella Zeiter/Extender: Referring Seleta Hovland: Marlena Clipper in Treatment: 3 Wound Status Wound Number: 1 Primary Open Surgical Wound Etiology: Wound Location: Right Head - Parietal Wound Open Wounding Event: Surgical Injury Status: Date Acquired: 12/25/2016 Comorbid Chronic sinus problems/congestion, Weeks Of Treatment: 3 History: Hypertension Clustered Wound: No Photos Wound Measurements Length: (cm) 0.4 Width: (cm) 0.2 Depth: (cm) 0.6 Area: (cm) 0.063 Volume: (cm) 0.038 % Reduction in Area: 59.9% % Reduction in Volume: -137.5% Epithelialization: Small (1-33%) Tunneling: No Undermining: Yes Starting Position (o'clock): 3 Ending Position (o'clock): 7 Maximum Distance: (cm) 0.9 Wound Description Full Thickness Without Exposed Foul Odor Af Classification: Support Structures Slough/Fibri Wound Margin: Flat and Intact Exudate Large Amount: Exudate Type: Serosanguineous Exudate Color: red, brown EVELETTE, HOLLERN ([id number]) ter Cleansing: No no No Wound Bed Granulation Amount: Large (67-100%) Exposed Structure Granulation Quality: Red Fascia Exposed: No Necrotic Amount: None Present (0%) Fat Layer (Subcutaneous Tissue) Exposed: No Tendon Exposed: No Muscle Exposed: No Joint Exposed: No Bone Exposed: No Limited to Skin Breakdown Periwound Skin Texture Texture Color No Abnormalities Noted: No No Abnormalities Noted: No Moisture No Abnormalities Noted: No Wound Preparation Ulcer Cleansing: Not Cleansed Topical Anesthetic Applied:  None Treatment Notes Wound #1 (Right Head - Parietal) 1. Cleansed with: Clean wound with Normal Saline 4. Dressing Applied: Prisma Ag Notes Coverlet to secure Electronic Signature(s) Signed: 07/03/2017 7:55:48 AM By: Gretta Cool, BSN, RN, CWS, Kim RN, BSN Entered By: Gretta Cool, BSN, RN, CWS, Kim on 06/29/2017 08:31:01 KESHA, HURRELL ([id number]) -------------------------------------------------------------------------------- Vitals Details Patient Name: [name], [name] 06/29/2017 8:15 Date of Service: AM Medical Record [id number] Number: Patient Account Number: [id number] Date of Birth/Sex: 1961/10/02 (56 y.o. Female) Treating RN: Benetta Spar, Other Clinician: Primary Care Florice Hindle: Sheppard Coil Treating Britto, Ishmael Holter, Dianelly Ferran/Extender: Referring Kelaiah Escalona: Marlena Clipper in Treatment: 3 Vital Signs Time Taken: 08:23 Temperature (F): 98.3 Height (in): 63 Pulse (bpm): 67 Weight (lbs): 182 Respiratory Rate (breaths/min): 16 Body Mass Index (BMI): 32.2 Blood Pressure (mmHg): 148/65 Reference Range: 80 - 120 mg / dl Electronic Signature(s) Signed: 07/03/2017 7:55:48 AM By: Gretta Cool, BSN, RN, CWS, Kim RN, BSN Entered By: Gretta Cool, BSN, RN, CWS, Kim on 06/29/2017 08:23:59

## 2017-07-03 NOTE — Progress Notes (Signed)
[name], [name] ([id number]) Visit Report for 06/29/2017 Chief Complaint Document Details Patient Name: [name], [name] 06/29/2017 8:15 Date of Service: AM Medical Record [id number] Number: Patient Account Number: [id number] Date of Birth/Sex: 1961/08/18 (56 y.o. Female) Treating RN: [name], Other Clinician: Primary Care Provider: Sheppard Adams Treating [name], [name], Provider/Extender: Referring Provider: Marlena Adams in Treatment: 3 Information Obtained from: Patient Chief Complaint 06/05/17; patient is here for a nonhealing wound on her surgical scar in her occiput Electronic Signature(s) Signed: 06/29/2017 11:54:07 AM By: Meredith Fudge MD, FACS Entered By: [name] on 06/29/2017 08:42:36 [name], [name] ([id number]) -------------------------------------------------------------------------------- HPI Details Patient Name: [name], [name] 06/29/2017 8:15 Date of Service: AM Medical Record [id number] Number: Patient Account Number: [id number] Date of Birth/Sex: 06-14-1961 (56 y.o. Female) Treating RN: [name], Other Clinician: Primary Care Provider: Sheppard Adams Treating Keidan Aumiller, [name], Provider/Extender: Referring Provider: Marlena Adams in Treatment: 3 History of Present Illness HPI Description: 06/05/17; this is a 56 year old woman who had actually been called about 2 weeks ago by her neurosurgeon Dr. Lacinda Adams. The story was that she had had a reasonably uneventful and successful meningioma resection in February of this year. She did well postoperatively and the substantial "L-shaped" wound on the scalp closed nicely. The patient tells me in some time in early April she developed a stinging sensation and there was apparently an open wound present. She saw Dr. Lacinda Adams who became concerned about an underlying infection. She hadn't a repeat MRI of the brain that apparently showed good postoperative surgical outcome without  other issues. She then went on to have 2 different courses of Keflex as well as Hibiclens to the area without final success in getting this to heal. She is here for our review of this. She states that it will close over and then once again she gets the same I uncomfortable stinging sensation that results in nonhealing. 06/13/17 on evaluation today patient continues to have an opening on her occiput region which has not really responded greatly to the collagen dressing over the last week. This has been a wound since surgery February 2018. Fortunately she has no erythema noted surrounding the wound No fevers, chills, nausea, or vomiting noted at this time. However she does have some tunneling towards the 12 o'clock location looking at her head on that I believe is collecting fluid and causing difficulty with this wound healing. She has discomfort ready to be a 2-3 out of 10 though not severe. 06/22/17 on evaluation today patient's wound on the upper portion of her head appears to be doing a little better. There is not as much erythema noted at this point in time. She has been having her family perform the dressing changes as previously recommended with the Iodoform Gauls and this has been doing very well. With that being said she does tell me that her pain is much improved compared to previous evaluation and in fact that is one of the best findings that she has noted thus far. No fevers, chills, nausea, or vomiting noted at this time. Electronic Signature(s) Signed: 06/29/2017 11:54:07 AM By: Meredith Fudge MD, FACS Entered By: [name] on 06/29/2017 08:43:01 [name], [name] ([id number]) -------------------------------------------------------------------------------- Physical Exam Details Patient Name: [name], [name] 06/29/2017 8:15 Date of Service: AM Medical Record [id number] Number: Patient Account Number: [id number] Date of Birth/Sex: 10/20/1961 (56 y.o. Female) Treating RN: [name], Other Clinician: Primary Care Provider: Sheppard Adams Treating Annabeth Tortora, [name], Provider/Extender: Referring Provider: Marlena Adams in Treatment: 3  Constitutional . Pulse regular. Respirations normal and unlabored. Afebrile. . Eyes Nonicteric. Reactive to light. Ears, Nose, Mouth, and Throat Lips, teeth, and gums WNL.Marland Kitchen Moist mucosa without lesions. Neck supple and nontender. No palpable supraclavicular or cervical adenopathy. Normal sized without goiter. Respiratory WNL. No retractions.. Cardiovascular Pedal Pulses WNL. No clubbing, cyanosis or edema. Lymphatic No adneopathy. No adenopathy. No adenopathy. Musculoskeletal Adexa without tenderness or enlargement.. Digits and nails w/o clubbing, cyanosis, infection, petechiae, ischemia, or inflammatory conditions.. Integumentary (Hair, Skin) No suspicious lesions. No crepitus or fluctuance. No peri-wound warmth or erythema. No masses.Marland Kitchen Psychiatric Judgement and insight Intact.. No evidence of depression, anxiety, or agitation.. Notes the wound on the crown of the scalp has no significant tunneling and is tiny. At this stage she will benefit from [name] AG to be overlaid. Electronic Signature(s) Signed: 06/29/2017 11:54:07 AM By: Meredith Fudge MD, FACS Entered By: [name] on 06/29/2017 08:43:34 [name], [name] ([id number]) -------------------------------------------------------------------------------- Physician Orders Details Patient Name: [name], [name] 06/29/2017 8:15 Date of Service: AM Medical Record [id number] Number: Patient Account Number: [id number] Date of Birth/Sex: [birth year] (56 y.o. Female) Treating RN: [name], Other Clinician: Primary Care Provider: Sheppard Adams Treating Doren Kaspar, [name], Provider/Extender: Referring Provider: Marlena Adams in Treatment: 3 Verbal / Phone Orders: No Diagnosis Coding Wound Cleansing Wound #1 Right Head -  Parietal o Clean wound with Normal Saline. Skin Barriers/Peri-Wound Care Wound #1 Right Head - Parietal o Skin Prep Primary Wound Dressing Wound #1 Right Head - Parietal o Prisma Ag Secondary Dressing Wound #1 Right Head - Parietal o Other - Coverlet Dressing Change Frequency Wound #1 Right Head - Parietal o Change dressing every day. Follow-up Appointments Wound #1 Right Head - Parietal o Return Appointment in 1 week. Electronic Signature(s) Signed: 06/29/2017 11:54:07 AM By: Meredith Fudge MD, FACS Signed: 07/03/2017 7:55:48 AM By: Gretta Cool, BSN, RN, CWS, Kim RN, BSN Entered By: Gretta Cool, BSN, RN, CWS, Kim on 06/29/2017 08:39:03 [name], [name] ([id number]) -------------------------------------------------------------------------------- Problem List Details Patient Name: [name], [name] 06/29/2017 8:15 Date of Service: AM Medical Record [id number] Number: Patient Account Number: [id number] Date of Birth/Sex: 01-14-1961 (56 y.o. 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 06/29/2017 8:15 Date of Service: AM Medical Record [id number] Number: Patient Account Number: [id number] Date of Birth/Sex:  12/22/1960 (56 y.o. Female) Treating RN: [name], Other Clinician: Primary Care Provider: Sheppard Adams Treating Emberlie Gotcher, [name], Provider/Extender: Referring Provider: Marlena Adams in Treatment: 3 Subjective Chief Complaint Information obtained from Patient 06/05/17; patient is here for a nonhealing wound on her surgical scar in her occiput History of Present Illness (HPI) 06/05/17; this is a 56 year old woman who had actually been called about 2 weeks ago by her neurosurgeon Dr. Lacinda Adams. The story was that she had had a reasonably uneventful and successful meningioma resection in February of this year. She did well postoperatively and the substantial "L-shaped" wound on the scalp closed nicely. The patient tells me in some time in early April she developed a stinging sensation  and there was apparently an open wound present. She saw Dr. Lacinda Adams who became concerned about an underlying infection. She hadn't a repeat MRI of the brain that apparently showed good postoperative surgical outcome without other issues. She then went on to have 2 different courses of Keflex as well as Hibiclens to the area without final success in getting this to heal. She is here for our review of this. She states that it will close over and then once again she gets the same I uncomfortable stinging sensation that results in nonhealing. 06/13/17 on evaluation today patient continues to have an opening on her occiput region which has not really responded greatly to the collagen dressing over the last week. This has been a wound since surgery February 2018. Fortunately she has no erythema noted surrounding the wound No fevers, chills, nausea, or vomiting noted at this time. However she does have some tunneling towards the 12 o'clock location looking at her head on that I believe is collecting fluid and causing difficulty with this wound healing. She has discomfort ready to be a 2-3 out of 10 though  not severe. 06/22/17 on evaluation today patient's wound on the upper portion of her head appears to be doing a little better. There is not as much erythema noted at this point in time. She has been having her family perform the dressing changes as previously recommended with the Iodoform Gauls and this has been doing very well. With that being said she does tell me that her pain is much improved compared to previous evaluation and in fact that is one of the best findings that she has noted thus far. No fevers, chills, nausea, or vomiting noted at this time. [name], [name]. ([id number]) Objective Constitutional Pulse regular. Respirations normal and unlabored. Afebrile. Vitals Time Taken: 8:23 AM, Height: 63 in, Weight: 182 lbs, BMI: 32.2, Temperature: 98.3 F, Pulse: 67 bpm, Respiratory Rate: 16 breaths/min, Blood Pressure: 148/65 mmHg. Eyes Nonicteric. Reactive to light. Ears, Nose, Mouth, and Throat Lips, teeth, and gums WNL.Marland Kitchen Moist mucosa without lesions. Neck supple and nontender. No palpable supraclavicular or cervical adenopathy. Normal sized without goiter. Respiratory WNL. No retractions.. Cardiovascular Pedal Pulses WNL. No clubbing, cyanosis or edema. Lymphatic No adneopathy. No adenopathy. No adenopathy. Musculoskeletal Adexa without tenderness or enlargement.. Digits and nails w/o clubbing, cyanosis, infection, petechiae, ischemia, or inflammatory conditions.Marland Kitchen Psychiatric Judgement and insight Intact.. No evidence of depression, anxiety, or agitation.. General Notes: the wound on the crown of the scalp has no significant tunneling and is tiny. At this stage she will benefit from [facility] AG to be overlaid. Integumentary (Hair, Skin) No suspicious lesions. No crepitus or fluctuance. No peri-wound warmth or erythema. No masses.. Wound #1 status is Open. Original cause of wound was Surgical Injury. The wound is located on the Right Head - Parietal. The wound measures 0.4cm  length x 0.2cm width x 0.6cm depth; 0.063cm^2 area and 0.038cm^3 volume. The wound is limited to skin breakdown. There is no tunneling noted, however, there is undermining starting at 3:00 and ending at 7:00 with a maximum distance of 0.9cm. There is a large amount of serosanguineous drainage noted. The wound margin is flat and intact. There is large (67-100%) red Moyano, Lina M. ([id number]) granulation within the wound bed. There is no necrotic tissue within the wound bed. 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 Skin Barriers/Peri-Wound Care: Wound #1 Right Head - Parietal: Skin Prep Primary Wound Dressing: Wound #1 Right Head - Parietal: Prisma Ag Secondary Dressing: Wound #1 Right Head - Parietal: Other - Coverlet Dressing Change Frequency: Wound #1 Right Head - Parietal: Change dressing every day. Follow-up Appointments: Wound #1 Right Head - Parietal: Return Appointment in 1 week. after review today I have recommended Prisma AG to be placed over the wound and covered with a small Band-Aid and change daily after shower. I anticipate discharge soon. [name], [name] ([id number]) Electronic Signature(s) Signed: 06/29/2017 11:54:07 AM By: Meredith Fudge MD, FACS Entered By: [name] on 06/29/2017 08:44:16 MARIAFERNANDA, HENDRICKSEN ([id number]) -------------------------------------------------------------------------------- SuperBill Details Patient Name: FANNY, AGAN. Date of Service: 06/29/2017 Medical Record Number: [id number] Patient Account Number: [id number] Date of Birth/Sex: October 02, 1961 (56 y.o. 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

## 2017-07-05 ENCOUNTER — Ambulatory Visit
Admission: RE | Admit: 2017-07-05 | Discharge: 2017-07-05 | Disposition: A | Discharge status: Home / Self Care | Payer: BLUE CROSS/BLUE SHIELD | Source: Ambulatory Visit | Attending: Family Medicine | Admitting: Family Medicine

## 2017-07-05 ENCOUNTER — Encounter: Payer: Self-pay | Admitting: Radiology

## 2017-07-05 DIAGNOSIS — Z1231 Encounter for screening mammogram for malignant neoplasm of breast: Secondary | ICD-10-CM | POA: Diagnosis not present

## 2017-07-05 DIAGNOSIS — Z1239 Encounter for other screening for malignant neoplasm of breast: Secondary | ICD-10-CM

## 2017-07-06 ENCOUNTER — Encounter: Payer: BLUE CROSS/BLUE SHIELD | Admitting: Surgery

## 2017-07-06 DIAGNOSIS — T8131XD Disruption of external operation (surgical) wound, not elsewhere classified, subsequent encounter: Secondary | ICD-10-CM | POA: Diagnosis not present

## 2017-07-06 DIAGNOSIS — T8189XD Other complications of procedures, not elsewhere classified, subsequent encounter: Secondary | ICD-10-CM | POA: Diagnosis not present

## 2017-07-06 DIAGNOSIS — L98498 Non-pressure chronic ulcer of skin of other sites with other specified severity: Secondary | ICD-10-CM | POA: Diagnosis not present

## 2017-07-06 DIAGNOSIS — Y839 Surgical procedure, unspecified as the cause of abnormal reaction of the patient, or of later complication, without mention of misadventure at the time of the procedure: Secondary | ICD-10-CM | POA: Diagnosis not present

## 2017-07-08 NOTE — Progress Notes (Addendum)
AGATA, LUCENTE ([id number]) Visit Report for 07/06/2017 Chief Complaint Document Details Patient Name: [name], [name] 07/06/2017 8:15 Date of Service: AM Medical Record [id number] Number: Patient Account Number: [id number] Date of Birth/Sex: August 01, 1961 (56 y.o. Female) Treating RN: Marjory Lies Judeen Hammans, Other Clinician: Primary Care Provider: Sheppard Coil Treating Cherron Blitzer, Ishmael Holter, Provider/Extender: Referring Provider: Marlena Clipper in Treatment: 4 Information Obtained from: Patient Chief Complaint 06/05/17; patient is here for a nonhealing wound on her surgical scar in her occiput Electronic Signature(s) Signed: 07/06/2017 3:31:36 PM By: Christin Fudge MD, FACS Entered By: Christin Fudge on 07/06/2017 08:49:45 JAYME, MEDNICK ([id number]) -------------------------------------------------------------------------------- HPI Details Patient Name: [name], [name] 07/06/2017 8:15 Date of Service: AM Medical Record [id number] Number: Patient Account Number: [id number] Date of Birth/Sex: 1961-08-06 (56 y.o. Female) Treating RN: Marjory Lies Judeen Hammans, Other Clinician: Primary Care Provider: Sheppard Coil Treating Ameena Vesey, Ishmael Holter, Provider/Extender: Referring Provider: Marlena Clipper in Treatment: 4 History of Present Illness HPI Description: 06/05/17; this is a 56 year old woman who had actually been called about 2 weeks ago by her neurosurgeon Dr. Lacinda Axon. The story was that she had had a reasonably uneventful and successful meningioma resection in February of this year. She did well postoperatively and the substantial "L-shaped" wound on the scalp closed nicely. The patient tells me in some time in early April she developed a stinging sensation and there was apparently an open wound present. She saw Dr. Lacinda Axon who became concerned about an underlying infection. She hadn't a repeat MRI of the brain that apparently showed good postoperative surgical outcome  without other issues. She then went on to have 2 different courses of Keflex as well as Hibiclens to the area without final success in getting this to heal. She is here for our review of this. She states that it will close over and then once again she gets the same I uncomfortable stinging sensation that results in nonhealing. 06/13/17 on evaluation today patient continues to have an opening on her occiput region which has not really responded greatly to the collagen dressing over the last week. This has been a wound since surgery February 2018. Fortunately she has no erythema noted surrounding the wound No fevers, chills, nausea, or vomiting noted at this time. However she does have some tunneling towards the 12 o'clock location looking at her head on that I believe is collecting fluid and causing difficulty with this wound healing. She has discomfort ready to be a 2-3 out of 10 though not severe. 06/22/17 on evaluation today patient's wound on the upper portion of her head appears to be doing a little better. There is not as much erythema noted at this point in time. She has been having her family perform the dressing changes as previously recommended with the Iodoform Gauls and this has been doing very well. With that being said she does tell me that her pain is much improved compared to previous evaluation and in fact that is one of the best findings that she has noted thus far. No fevers, chills, nausea, or vomiting noted at this time. Electronic Signature(s) Signed: 07/06/2017 3:31:36 PM By: Christin Fudge MD, FACS Entered By: Christin Fudge on 07/06/2017 08:49:48 MAYSIE, PARKHILL ([id number]) -------------------------------------------------------------------------------- Physical Exam Details Patient Name: [name], [name] 07/06/2017 8:15 Date of Service: AM Medical Record [id number] Number: Patient Account Number: [id number] Date of Birth/Sex: May 20, 1961 (56 y.o. Female) Treating RN:  Dorthy, Judeen Hammans, Other Clinician: Primary Care Provider: Sheppard Coil Treating Lucky Alverson, Ishmael Holter, Provider/Extender: Referring Provider: Marlena Clipper in Treatment: 4  Constitutional . Pulse regular. Respirations normal and unlabored. Afebrile. . Eyes Nonicteric. Reactive to light. Ears, Nose, Mouth, and Throat Lips, teeth, and gums WNL.Marland Kitchen Moist mucosa without lesions. Neck supple and nontender. No palpable supraclavicular or cervical adenopathy. Normal sized without goiter. Respiratory WNL. No retractions.. Breath sounds WNL, No rubs, rales, rhonchi, or wheeze.. Cardiovascular Heart rhythm and rate regular, no murmur or gallop.. Pedal Pulses WNL. No clubbing, cyanosis or edema. Lymphatic No adneopathy. No adenopathy. No adenopathy. Musculoskeletal Adexa without tenderness or enlargement.. Digits and nails w/o clubbing, cyanosis, infection, petechiae, ischemia, or inflammatory conditions.. Integumentary (Hair, Skin) No suspicious lesions. No crepitus or fluctuance. No peri-wound warmth or erythema. No masses.Marland Kitchen Psychiatric Judgement and insight Intact.. No evidence of depression, anxiety, or agitation.. Notes the wound is almost completely healed with a small ledge of scalp may be a millimeter or two which seems to be open. At this stage productive form is all she needs. Electronic Signature(s) Signed: 07/06/2017 3:31:36 PM By: Christin Fudge MD, FACS Entered By: Christin Fudge on 07/06/2017 08:50:55 MAJORIE, SANTEE ([id number]) -------------------------------------------------------------------------------- Physician Orders Details Patient Name: [name], [name] 07/06/2017 8:15 Date of Service: AM Medical Record [id number] Number: Patient Account Number: [id number] Date of Birth/Sex: May 14, 1961 (56 y.o. 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 Skin Barriers/Peri-Wound Care Wound #1 Right Head - Parietal o Skin Prep Primary Wound Dressing Wound #1 Right Head - Parietal o Prisma Ag Secondary Dressing Wound #1 Right Head - Parietal o Other - Coverlet Dressing Change Frequency Wound #1 Right Head - Parietal o Change dressing every day. Discharge From [facility] Services Wound #1 Right Head - Parietal o Discharge from [place] Signature(s) BLAIR, MESINA McNary. ([id number]) Signed: 07/06/2017 4:13:53 PM By: Montey Hora Signed: 07/12/2017 8:02:46 AM By: Christin Fudge MD, FACS Entered By: Montey Hora on 07/06/2017 16:13:53 Marando, [name] ([id number]) -------------------------------------------------------------------------------- Problem List Details Patient Name: HIDAYA, DANIEL 07/06/2017 8:15 Date of Service: AM Medical Record [id number] Number: Patient Account Number: [id number] Date of Birth/Sex: 08-Jun-1961 (56 y.o. Female) Treating RN: Dorthy, Judeen Hammans, Other Clinician: Primary Care Provider: Sheppard Coil Treating Delmas Faucett, Ishmael Holter, Provider/Extender: Referring Provider: Marlena Clipper in Treatment: 4 Active Problems ICD-10 Encounter Code Description Active Date Diagnosis T81.31XD Disruption of external operation (surgical) wound, not 06/05/2017 Yes elsewhere classified, subsequent encounter L98.498 Non-pressure chronic ulcer of other sites with other 06/05/2017 Yes specified severity Inactive Problems Resolved Problems Electronic Signature(s) Signed:  07/06/2017 3:31:36 PM By: Christin Fudge MD, FACS Entered By: Christin Fudge on 07/06/2017 08:49:33 Shave, [name] ([id number]) -------------------------------------------------------------------------------- Progress Note Details Patient Name: [name], [name]. 07/06/2017 8:15 Date of Service: AM Medical Record [id number] Number: Patient Account Number: [id number] Date of Birth/Sex: 14-Jan-1961 (56 y.o. Female) Treating RN: Marjory Lies Judeen Hammans, Other Clinician: Primary Care Provider: Sheppard Coil Treating Montrae Braithwaite, Ishmael Holter, Provider/Extender: Referring Provider: Marlena Clipper in Treatment: 4 Subjective Chief Complaint Information obtained from Patient 06/05/17; patient is here for a nonhealing wound on her surgical scar in her occiput History of Present Illness (HPI) 06/05/17; this is a 56 year old woman who had actually been called about 2 weeks ago by her neurosurgeon Dr. Lacinda Axon.  The story was that she had had a reasonably uneventful and successful meningioma resection in February of this year. She did well postoperatively and the substantial "L-shaped" wound on the scalp closed nicely. The patient tells me in some time in early April she developed a stinging sensation and there was apparently an open wound present. She saw Dr. Lacinda Axon who became concerned about an underlying infection. She hadn't a repeat MRI of the brain that apparently showed good postoperative surgical outcome without other issues. She then went on to have 2 different courses of Keflex as well as Hibiclens to the area without final success in getting this to heal. She is here for our review of this. She states that it will close over and then once again she gets the same I uncomfortable stinging sensation that results in nonhealing. 06/13/17 on evaluation today patient continues to have an opening on her occiput region which has not really responded greatly to the collagen dressing over the last week. This has  been a wound since surgery February 2018. Fortunately she has no erythema noted surrounding the wound No fevers, chills, nausea, or vomiting noted at this time. However she does have some tunneling towards the 12 o'clock location looking at her head on that I believe is collecting fluid and causing difficulty with this wound healing. She has discomfort ready to be a 2-3 out of 10 though not severe. 06/22/17 on evaluation today patient's wound on the upper portion of her head appears to be doing a little better. There is not as much erythema noted at this point in time. She has been having her family perform the dressing changes as previously recommended with the Iodoform Gauls and this has been doing very well. With that being said she does tell me that her pain is much improved compared to previous evaluation and in fact that is one of the best findings that she has noted thus far. No fevers, chills, nausea, or vomiting noted at this time. ADRINA, ARMIJO. ([id number]) Objective Constitutional Pulse regular. Respirations normal and unlabored. Afebrile. Vitals Time Taken: 8:22 AM, Height: 63 in, Weight: 182 lbs, BMI: 32.2, Temperature: 98.4 F, Pulse: 63 bpm, Respiratory Rate: 18 breaths/min, Blood Pressure: 130/65 mmHg. Eyes Nonicteric. Reactive to light. Ears, Nose, Mouth, and Throat Lips, teeth, and gums WNL.Marland Kitchen Moist mucosa without lesions. Neck supple and nontender. No palpable supraclavicular or cervical adenopathy. Normal sized without goiter. Respiratory WNL. No retractions.. Breath sounds WNL, No rubs, rales, rhonchi, or wheeze.. Cardiovascular Heart rhythm and rate regular, no murmur or gallop.. Pedal Pulses WNL. No clubbing, cyanosis or edema. Lymphatic No adneopathy. No adenopathy. No adenopathy. Musculoskeletal Adexa without tenderness or enlargement.. Digits and nails w/o clubbing, cyanosis, infection, petechiae, ischemia, or inflammatory conditions.Marland Kitchen Psychiatric Judgement  and insight Intact.. No evidence of depression, anxiety, or agitation.. General Notes: the wound is almost completely healed with a small ledge of scalp may be a millimeter or two which seems to be open. At this stage productive form is all she needs. Integumentary (Hair, Skin) No suspicious lesions. No crepitus or fluctuance. No peri-wound warmth or erythema. No masses.. Wound #1 status is Open. Original cause of wound was Surgical Injury. The wound is located on the Right Head - Parietal. The wound measures 0.4cm length x 0.1cm width x 0.1cm depth; 0.031cm^2 area and 0.003cm^3 volume. The wound is limited to skin breakdown. There is no tunneling or undermining noted. There is a large amount of serosanguineous drainage noted. The wound margin is  flat and intact. There is large (67-100%) red granulation within the wound bed. There is no necrotic tissue within the wound bed. JOSSLIN, SANJUAN. 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 Skin Barriers/Peri-Wound Care: Wound #1 Right Head - Parietal: Skin Prep Primary Wound Dressing: Wound #1 Right Head - Parietal: Prisma Ag Secondary Dressing: Wound #1 Right Head - Parietal: Other - Coverlet Dressing Change Frequency: Wound #1 Right Head - Parietal: Change dressing every day. Discharge From [facility] Services: Wound #1 Right Head - Parietal: Discharge from [place] clinically the patient's wound is healed very well and I'm discharging her from the wound care services and have asked her to put a protective foam dressing over this. She will report back only if there is any discharge or changes. Electronic Signature(s) SAIDAH, KEMPTON ([id number]) Signed:  07/12/2017 12:50:15 PM By: Christin Fudge MD, FACS Previous Signature: 07/06/2017 3:31:36 PM Version By: Christin Fudge MD, FACS Entered By: Christin Fudge on 07/12/2017 08:05:13 EYLEEN, RAWLINSON ([id number]) -------------------------------------------------------------------------------- SuperBill Details Patient Name: AMARYA, KUEHL. Date of Service: 07/06/2017 Medical Record Number: [id number] Patient Account Number: [id number] Date of Birth/Sex: 1961/03/07 (56 y.o. 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 Facility Procedures CPT4 Code: [id number] Description: [id number] - WOUND CARE VISIT-LEV 2 EST PT Modifier: Quantity: 1 Physician Procedures CPT4: Description Modifier Quantity Code [id number] - WC PHYS LEVEL 2 - EST PT 1 ICD-10 Description Diagnosis T81.31XD Disruption of external operation (surgical) wound, not elsewhere classified, subsequent encounter L98.498 Non-pressure chronic ulcer  of other sites with other specified severity Electronic Signature(s) Signed: 07/06/2017 4:14:24 PM By: Montey Hora Signed: 07/12/2017 8:02:46 AM By: Christin Fudge MD, FACS Previous Signature: 07/06/2017 3:31:36 PM Version By: Christin Fudge MD, FACS Entered By: Montey Hora on 07/06/2017 16:14:24

## 2017-07-10 NOTE — Progress Notes (Addendum)
Meredith, Adams ([id number]) Visit Report for 07/06/2017 Arrival Information Details Patient Name: [name], [name] 07/06/2017 8:15 Date of Service: AM Medical Record [id number] Number: Patient Account Number: [id number] Date of Birth/Sex: 12/21/1960 (56 y.o. Female) Treating RN: Dorthy, Judeen Hammans, Other Clinician: Primary Care Emerald Shor: Sheppard Coil Treating Britto, Ishmael Holter, Elih Mooney/Extender: Referring Naika Noto: [name] in Treatment: 4 Visit Information History Since Last Visit Added or deleted any medications: No Patient Arrived: Ambulatory Any new allergies or adverse reactions: No Arrival Time: 08:20 Had a fall or experienced change in No Accompanied By: self activities of daily living that may affect Transfer Assistance: None risk of falls: Patient Identification Verified: Yes Signs or symptoms of abuse/neglect since last No Secondary Verification Process Yes visito Completed: Hospitalized since last visit: No Patient Requires Transmission-Based No Has Dressing in Place as Prescribed: Yes Precautions: Pain Present Now: No Patient Has Alerts: No Electronic Signature(s) Signed: 07/06/2017 4:32:48 PM By: Montey Hora Entered By: Montey Hora on 07/06/2017 08:20:52 [name] ([id number]) -------------------------------------------------------------------------------- Clinic Level of Care Assessment Details Patient Name: [name], [name] 07/06/2017 8:15 Date of Service: AM Medical Record [id number] Number: Patient Account Number: [id number] Date of Birth/Sex: July 14, 1961 (56 y.o. Female) Treating RN: Laural Roes, Other Clinician: Primary Care Archibald Marchetta: Sheppard Coil Treating Britto, Ishmael Holter, Tessa Seaberry/Extender: Referring Roylee Chaffin: [name] in Treatment: 4 Clinic Level of Care Assessment Items TOOL 4 Quantity Score []  - Use when only an EandM is performed on FOLLOW-UP visit 0 ASSESSMENTS - Nursing  Assessment / Reassessment X - Reassessment of Co-morbidities (includes updates in patient status) 1 10 X - Reassessment of Adherence to Treatment Plan 1 5 ASSESSMENTS - Wound and Skin Assessment / Reassessment X - Simple Wound Assessment / Reassessment - one wound 1 5 []  - Complex Wound Assessment / Reassessment - multiple wounds 0 []  - Dermatologic / Skin Assessment (not related to wound area) 0 ASSESSMENTS - Focused Assessment []  - Circumferential Edema Measurements - multi extremities 0 []  - Nutritional Assessment / Counseling / Intervention 0 []  - Lower Extremity Assessment (monofilament, tuning fork, pulses) 0 []  - Peripheral Arterial Disease Assessment (using hand held doppler) 0 ASSESSMENTS - Ostomy and/or Continence Assessment and Care []  - Incontinence Assessment and Management 0 []  - Ostomy Care Assessment and Management (repouching, etc.) 0 PROCESS - Coordination of Care X - Simple Patient / Family Education for ongoing care 1 15 []  - Complex (extensive) Patient / Family Education for ongoing care 0 []  - Staff obtains Consents, Records, Test Results / Process Orders 0 Meredith, Adams ([id number]) []  - Staff telephones HHA, Nursing Homes / Clarify orders / etc 0 []  - Routine Transfer to another Facility (non-emergent condition) 0 []  - Routine Hospital Admission (non-emergent condition) 0 []  - New Admissions / Biomedical engineer / Ordering NPWT, Apligraf, etc. 0 []  - Emergency Hospital Admission (emergent condition) 0 X - Simple Discharge Coordination 1 10 []  - Complex (extensive) Discharge Coordination 0 PROCESS - Special Needs []  - Pediatric / Minor Patient Management 0 []  - Isolation Patient Management 0 []  - Hearing / Language / Visual special needs 0 []  - Assessment of Community assistance (transportation, D/C planning, etc.) 0 []  - Additional assistance / Altered mentation 0 []  - Support Surface(s) Assessment (bed, cushion, seat, etc.) 0 INTERVENTIONS - Wound  Cleansing / Measurement X - Simple Wound Cleansing - one wound 1 5 []  - Complex Wound Cleansing - multiple wounds 0 X - Wound Imaging (photographs - any number of wounds) 1 5 []  - Wound  Tracing (instead of photographs) 0 X - Simple Wound Measurement - one wound 1 5 []  - Complex Wound Measurement - multiple wounds 0 INTERVENTIONS - Wound Dressings X - Small Wound Dressing one or multiple wounds 1 10 []  - Medium Wound Dressing one or multiple wounds 0 []  - Large Wound Dressing one or multiple wounds 0 []  - Application of Medications - topical 0 []  - Application of Medications - injection 0 [name], [name]. ([id number]) INTERVENTIONS - Miscellaneous []  - External ear exam 0 []  - Specimen Collection (cultures, biopsies, blood, body fluids, etc.) 0 []  - Specimen(s) / Culture(s) sent or taken to Lab for analysis 0 []  - Patient Transfer (multiple staff / [name] Lift / Similar devices) 0 []  - Simple Staple / Suture removal (25 or less) 0 []  - Complex Staple / Suture removal (26 or more) 0 []  - Hypo / Hyperglycemic Management (close monitor of Blood Glucose) 0 []  - Ankle / Brachial Index (ABI) - do not check if billed separately 0 X - Vital Signs 1 5 Has the patient been seen at the hospital within the last three years: Yes Total Score: 75 Level Of Care: New/Established - Level 2 Electronic Signature(s) Signed: 07/06/2017 4:32:48 PM By: Montey Hora Entered By: Montey Hora on 07/06/2017 16:14:16 Adams, Meredith Reading ([id number]) -------------------------------------------------------------------------------- Encounter Discharge Information Details Patient Name: Meredith, Adams. 07/06/2017 8:15 Date of Service: AM Medical Record [id number] Number: Patient Account Number: [id number] Date of Birth/Sex: 12-10-1960 (56 y.o. Female) Treating RN: Dorthy, Judeen Hammans, Other Clinician: Primary Care Vern Prestia: Sheppard Coil Treating Britto, Ishmael Holter, Marik Sedore/Extender: Referring  Almir Botts: [name] in Treatment: 4 Encounter Discharge Information Items Discharge Pain Level: 0 Discharge Condition: Stable Ambulatory Status: Ambulatory Discharge Destination: Home Transportation: Private Auto Accompanied By: self Schedule Follow-up Appointment: Yes Medication Reconciliation completed and provided to Patient/Care No Senya Hinzman: Provided on Clinical Summary of Care: 07/06/2017 Form Type Recipient Paper Patient DP Electronic Signature(s) Signed: 07/10/2017 9:43:33 AM By: Ruthine Dose Entered By: Ruthine Dose on 07/06/2017 08:46:43 Rottinghaus, Josilynn Jerilynn Mages ([id number]) -------------------------------------------------------------------------------- Multi Wound Chart Details Patient Name: [name]. 07/06/2017 8:15 Date of Service: AM Medical Record [id number] Number: Patient Account Number: [id number] Date of Birth/Sex: 03-24-1961 (56 y.o. Female) Treating RN: Dorthy, Judeen Hammans, Other Clinician: Primary Care Meagan Spease: Sheppard Coil Treating Britto, Ishmael Holter, Makaio Mach/Extender: Referring Amadu Schlageter: [name] in Treatment: 4 Vital Signs Height(in): 63 Pulse(bpm): 63 Weight(lbs): 182 Blood Pressure 130/65 (mmHg): Body Mass Index(BMI): 32 Temperature(F): 98.4 Respiratory Rate 18 (breaths/min): Photos: [1:No Photos] [N/A:N/A] Wound Location: [1:Right Head - Parietal] [N/A:N/A] Wounding Event: [1:Surgical Injury] [N/A:N/A] Primary Etiology: [1:Open Surgical Wound] [N/A:N/A] Comorbid History: [1:Chronic sinus problems/congestion, Hypertension] [N/A:N/A] Date Acquired: [1:12/25/2016] [N/A:N/A] Weeks of Treatment: [1:4] [N/A:N/A] Wound Status: [1:Open] [N/A:N/A] Measurements L x W x D 0.4x0.1x0.1 [N/A:N/A] (cm) Area (cm) : [1:0.031] [N/A:N/A] Volume (cm) : [1:0.003] [N/A:N/A] % Reduction in Area: [1:80.30%] [N/A:N/A] % Reduction in Volume: 81.30% [N/A:N/A] Classification: [1:Full Thickness Without Exposed Support Structures]  [N/A:N/A] Exudate Amount: [1:Large] [N/A:N/A] Exudate Type: [1:Serosanguineous] [N/A:N/A] Exudate Color: [1:red, brown] [N/A:N/A] Wound Margin: [1:Flat and Intact] [N/A:N/A] Granulation Amount: [1:Large (67-100%)] [N/A:N/A] Granulation Quality: [1:Red] [N/A:N/A] Necrotic Amount: [1:None Present (0%)] [N/A:N/A] Exposed Structures: [N/A:N/A] Fascia: No Fat Layer (Subcutaneous Tissue) Exposed: No Tendon: No Muscle: No Joint: No Bone: No Limited to Skin Breakdown Epithelialization: Small (1-33%) N/A N/A Periwound Skin Texture: No Abnormalities Noted N/A N/A Periwound Skin No Abnormalities Noted N/A N/A Moisture: Periwound Skin Color: No Abnormalities Noted N/A N/A Tenderness on No N/A N/A Palpation: Wound Preparation:  Ulcer Cleansing: N/A N/A Rinsed/Irrigated with Saline Topical Anesthetic Applied: None Treatment Notes Electronic Signature(s) Signed: 07/06/2017 3:31:36 PM By: Christin Fudge MD, FACS Entered By: Christin Fudge on 07/06/2017 08:49:40 [name], [name] ([id number]) -------------------------------------------------------------------------------- [place] Details Patient Name: [name], [name] 07/06/2017 8:15 Date of Service: AM Medical Record [id number] Number: Patient Account Number: [id number] Date of Birth/Sex: 03-31-1961 (56 y.o. Female) Treating RN: Dorthy, Judeen Hammans, Other Clinician: Primary Care Malikye Reppond: Sheppard Coil Treating Britto, Ishmael Holter, Aubrionna Istre/Extender: Referring Jia Dottavio: [name] in Treatment: 4 Active Inactive Electronic Signature(s) Signed: 07/12/2017 11:33:14 AM By: Gretta Cool BSN, RN, CWS, Kim RN, BSN Signed: 07/12/2017 4:41:02 PM By: Montey Hora Previous Signature: 07/06/2017 4:32:48 PM Version By: Montey Hora Entered By: Gretta Cool BSN, RN, CWS, Kim on 07/12/2017 09:05:48 [name], [name] ([id number]) -------------------------------------------------------------------------------- Pain Assessment  Details Patient Name: [name], [name] 07/06/2017 8:15 Date of Service: AM Medical Record [id number] Number: Patient Account Number: [id number] Date of Birth/Sex: 1961/08/03 (56 y.o. Female) Treating RN: Dorthy, Judeen Hammans, Other Clinician: Primary Care Teriann Livingood: Sheppard Coil Treating Britto, Ishmael Holter, Zeriah Baysinger/Extender: Referring Josyah Achor: [name] in Treatment: 4 Active Problems Location of Pain Severity and Description of Pain Patient Has Paino No Site Locations Pain Management and Medication Current Pain Management: Notes Topical or injectable lidocaine is offered to patient for acute pain when surgical debridement is performed. If needed, Patient is instructed to use over the counter pain medication for the following 24-48 hours after debridement. Wound care MDs do not prescribed pain medications. Patient has chronic pain or uncontrolled pain. Patient has been instructed to make an appointment with their Primary Care Physician for pain management. Electronic Signature(s) Signed: 07/06/2017 4:32:48 PM By: Montey Hora Entered By: Montey Hora on 07/06/2017 08:21:02 [name] ([id number]) -------------------------------------------------------------------------------- Patient/Caregiver Education Details Patient Name: [name], [name] 07/06/2017 8:15 Date of Service: AM Medical Record [id number] Number: Patient Account Number: [id number] Date of Birth/Gender: 09/08/1961 (56 y.o. Female) Treating RN: Montey Hora [place], Other Clinician: Physician: Sheppard Coil Treating Britto, Ishmael Holter, Physician/Extender: Referring Physician: Marlena Adams in Treatment: 4 Education Assessment Education Provided To: Patient Education Topics Provided Wound/Skin Impairment: Handouts: Other: wound care as ordered Methods: Demonstration, Explain/Verbal Responses: State content correctly Electronic Signature(s) Signed: 07/06/2017  4:32:48 PM By: Montey Hora Entered By: Montey Hora on 07/06/2017 08:29:08 [name] ([id number]) -------------------------------------------------------------------------------- Wound Assessment Details Patient Name: [name], [name]. 07/06/2017 8:15 Date of Service: AM Medical Record [id number] Number: Patient Account Number: [id number] Date of Birth/Sex: July 19, 1961 (56 y.o. Female) Treating RN: Laural Roes, Other Clinician: Primary Care Breylin Dom: Sheppard Coil Treating Britto, Ishmael Holter, Anthonee Gelin/Extender: Referring Kaitlinn Iversen: [name] in Treatment: 4 Wound Status Wound Number: 1 Primary Open Surgical Wound Etiology: Wound Location: Right Head - Parietal Wound Open Wounding Event: Surgical Injury Status: Date Acquired: 12/25/2016 Comorbid Chronic sinus problems/congestion, Weeks Of Treatment: 4 History: Hypertension Clustered Wound: No Photos Photo Uploaded By: Montey Hora on 07/06/2017 16:31:04 Wound Measurements Length: (cm) 0.4 Width: (cm) 0.1 Depth: (cm) 0.1 Area: (cm) 0.031 Volume: (cm) 0.003 % Reduction in Area: 80.3% % Reduction in Volume: 81.3% Epithelialization: Small (1-33%) Tunneling: No Undermining: No Wound Description Full Thickness Without Exposed Foul Odor Aft Classification: Support Structures Slough/Fibrin Wound Margin: Flat and Intact Exudate Large Amount: Exudate Type: Serosanguineous Exudate Color: red, brown er Cleansing: No o No Wound Bed [name], Meredith M. ([id number]) Granulation Amount: Large (67-100%) Exposed Structure Granulation Quality: Red Fascia Exposed: No Necrotic Amount: None Present (0%) Fat Layer (Subcutaneous Tissue) Exposed: No Tendon Exposed: No Muscle Exposed: No  Joint Exposed: No Bone Exposed: No Limited to Skin Breakdown Periwound Skin Texture Texture Color No Abnormalities Noted: No No Abnormalities Noted: No Moisture No Abnormalities Noted: No Wound  Preparation Ulcer Cleansing: Rinsed/Irrigated with Saline Topical Anesthetic Applied: None Electronic Signature(s) Signed: 07/06/2017 4:32:48 PM By: Montey Hora Entered By: Montey Hora on 07/06/2017 08:27:51 Gervase, Izabellah Jerilynn Mages ([id number]) -------------------------------------------------------------------------------- Vitals Details Patient Name: [name]. 07/06/2017 8:15 Date of Service: AM Medical Record [id number] Number: Patient Account Number: [id number] Date of Birth/Sex: Sep 21, 1961 (56 y.o. Female) Treating RN: Laural Roes, Other Clinician: Primary Care Latisa Belay: Sheppard Coil Treating Britto, Ishmael Holter, Sadiel Mota/Extender: Referring Massiel Stipp: [name] in Treatment: 4 Vital Signs Time Taken: 08:22 Temperature (F): 98.4 Height (in): 63 Pulse (bpm): 63 Weight (lbs): 182 Respiratory Rate (breaths/min): 18 Body Mass Index (BMI): 32.2 Blood Pressure (mmHg): 130/65 Reference Range: 80 - 120 mg / dl Electronic Signature(s) Signed: 07/06/2017 4:32:48 PM By: Montey Hora Entered By: Montey Hora on 07/06/2017 08:23:00

## 2017-08-07 ENCOUNTER — Other Ambulatory Visit: Payer: Self-pay

## 2017-08-07 DIAGNOSIS — I10 Essential (primary) hypertension: Secondary | ICD-10-CM

## 2017-08-07 MED ORDER — HYDROCHLOROTHIAZIDE 25 MG PO TABS: ORAL_TABLET | ORAL | 3 refills | Status: DC

## 2017-08-21 ENCOUNTER — Other Ambulatory Visit: Payer: Self-pay

## 2017-08-21 DIAGNOSIS — I10 Essential (primary) hypertension: Secondary | ICD-10-CM

## 2017-08-21 MED ORDER — LOSARTAN POTASSIUM 100 MG PO TABS: 100.0000 mg | ORAL_TABLET | Freq: Every day | ORAL | 3 refills | Status: DC

## 2017-08-29 ENCOUNTER — Other Ambulatory Visit: Payer: Self-pay

## 2017-08-29 DIAGNOSIS — I10 Essential (primary) hypertension: Secondary | ICD-10-CM

## 2017-08-29 MED ORDER — LOSARTAN POTASSIUM 100 MG PO TABS: 100.0000 mg | ORAL_TABLET | Freq: Every day | ORAL | 3 refills | Status: DC

## 2017-09-04 ENCOUNTER — Encounter: Payer: Self-pay | Admitting: Family Medicine

## 2017-09-04 ENCOUNTER — Ambulatory Visit
Admission: RE | Admit: 2017-09-04 | Discharge: 2017-09-04 | Disposition: A | Discharge status: Home / Self Care | Payer: BLUE CROSS/BLUE SHIELD | Source: Ambulatory Visit | Attending: Family Medicine | Admitting: Family Medicine

## 2017-09-04 ENCOUNTER — Ambulatory Visit (INDEPENDENT_AMBULATORY_CARE_PROVIDER_SITE_OTHER): Payer: BLUE CROSS/BLUE SHIELD | Admitting: Family Medicine

## 2017-09-04 VITALS — BP 136/74 | HR 57 | Temp 98.6°F | Resp 16 | Ht 62.0 in | Wt 184.8 lb

## 2017-09-04 DIAGNOSIS — M79642 Pain in left hand: Secondary | ICD-10-CM

## 2017-09-04 DIAGNOSIS — I10 Essential (primary) hypertension: Secondary | ICD-10-CM

## 2017-09-04 DIAGNOSIS — M8949 Other hypertrophic osteoarthropathy, multiple sites: Secondary | ICD-10-CM

## 2017-09-04 DIAGNOSIS — M79646 Pain in unspecified finger(s): Secondary | ICD-10-CM | POA: Diagnosis not present

## 2017-09-04 DIAGNOSIS — G8929 Other chronic pain: Secondary | ICD-10-CM

## 2017-09-04 DIAGNOSIS — M85641 Other cyst of bone, right hand: Secondary | ICD-10-CM | POA: Diagnosis not present

## 2017-09-04 DIAGNOSIS — M19041 Primary osteoarthritis, right hand: Secondary | ICD-10-CM | POA: Insufficient documentation

## 2017-09-04 DIAGNOSIS — M159 Polyosteoarthritis, unspecified: Secondary | ICD-10-CM

## 2017-09-04 DIAGNOSIS — R7309 Other abnormal glucose: Secondary | ICD-10-CM | POA: Diagnosis not present

## 2017-09-04 DIAGNOSIS — M79645 Pain in left finger(s): Secondary | ICD-10-CM

## 2017-09-04 DIAGNOSIS — M150 Primary generalized (osteo)arthritis: Secondary | ICD-10-CM | POA: Diagnosis not present

## 2017-09-04 DIAGNOSIS — M19042 Primary osteoarthritis, left hand: Secondary | ICD-10-CM | POA: Diagnosis not present

## 2017-09-04 DIAGNOSIS — Z1211 Encounter for screening for malignant neoplasm of colon: Secondary | ICD-10-CM | POA: Diagnosis not present

## 2017-09-04 DIAGNOSIS — Z23 Encounter for immunization: Secondary | ICD-10-CM | POA: Diagnosis not present

## 2017-09-04 DIAGNOSIS — M79641 Pain in right hand: Secondary | ICD-10-CM | POA: Insufficient documentation

## 2017-09-04 DIAGNOSIS — E782 Mixed hyperlipidemia: Secondary | ICD-10-CM

## 2017-09-04 MED ORDER — DICLOFENAC SODIUM 1 % TD GEL: 2.0000 g | Freq: Three times a day (TID) | TRANSDERMAL | 5 refills | Status: DC

## 2017-09-04 NOTE — Assessment & Plan Note (Signed)
Elevated LDL otherwise normal lipids. Not on statin. Improved lifestyle Last lipid panel 08/2017 Calculated ASCVD 10 yr risk score 3.5% = low  Plan: 1. Reviewed ASCVD risk, and reassuring with low risk despite elevated LDL 2. Future would recommend ASA 81mg  for primary ASCVD risk reduction 3. Encourage improved lifestyle - low carb/cholesterol, reduce portion size, continue improving regular exercise 4. Follow-up

## 2017-09-04 NOTE — Patient Instructions (Addendum)
Thank you for coming to the clinic today.  1. 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 In the future you can repeat the same everyday Tylenol course for 1-2 weeks at a time.   - This is safe to take with anti-inflammatory medicines (Ibuprofen, Advil, Naproxen, Aleve, Meloxicam, Mobic)  If needed can take the Meloxicam 7.5mg  can take 1 tab daily for few days to 1-2 weeks max then stop for few weeks, do not mix with ibuprofen  NOW instead of meloxicam we can try the topical Diclofenac (Voltaren) gel, use as needed INSTEAD of the meloxicam.  In future we can try some X-rays.  Please schedule a Follow-up Appointment to: Return in about 4 months (around 01/03/2018) for HTN, Arthritis Hands/Thumbs.  If you have any other questions or concerns, please feel free to call the clinic or send a message through [place]. You may also schedule an earlier appointment if necessary.  Additionally, you may be receiving a survey about your experience at our clinic within a few days to 1 week by e-mail or mail. We value your feedback.  Nobie Putnam, DO [place]

## 2017-09-04 NOTE — Assessment & Plan Note (Signed)
Due for colonoscopy Last done 2008 [facility] Scheduled already for office visit with [facility] 09/2017 for next screening colonoscopy

## 2017-09-04 NOTE — Assessment & Plan Note (Signed)
Improved with A1c 5.5 (previously had a 5.7 no confirm dx PreDM)  Plan:  1. Not on any therapy currently  2. Encourage improved lifestyle - low carb, low sugar diet, reduce portion size, continue improving regular exercise 3. Follow-up 6-12 mo a1c

## 2017-09-04 NOTE — Progress Notes (Signed)
Subjective:    Patient ID: [name], female    DOB: 1960-12-24, 56 y.o.   MRN: [id number]  [name] is a 56 y.o. female presenting on 09/04/2017 for Hypertension   HPI   Here for Lab Review.  CHRONIC HTN: Reports checks BP occasionally x 1 every other week approximately, home readings 135-140 on average Current Meds - HCTZ 25mg , Losartan 100mg  Reports good compliance, took meds today. Tolerating well, w/o complaints. - Drinks 1-3 cups of decaf coffee (switched from caff to decaf postop 12/2016) Denies CP, dyspnea, HA, edema, dizziness / lightheadedness  Elevated A1c Reports prior history of elevated A1c 5.7 1 year ago, now repeat check in 06/2017 with A1c 5.5 CBGs: Not checking Meds: Never on Reports good compliance. Tolerating well w/o side-effects Currently on ARB Lifestyle: - Diet (Tries to avoid sweets and excessive desserts, portion moderation, more vegetables, reduce potatoes and carbs, drinks mostly water some unsweet tea, avoids sodas)  - Exercise (daily walking 15-20 min regularly, still thinks endurance level is not back to normal post-op Feb 2018, has done PT in past) Denies hypoglycemia, polyuria, visual changes, numbness or tingling.  Osteoarthritis / Hand Joint Pain - R Handed - Reports onset of symptoms few years ago followed by PCP, has had problem with bilateral thumb and hand joint pain for years, gradual decline, has never had imaging or x-rays of hands. Symptoms worse in winter weather. - Prior history of repetitive motions with keeping checkbook at work and writing, housework vacuuming, crocheting in past, she stopped this hobby in past. - She has been taking Meloxicam 7.5mg  PRN for few years, seems to take often, usually better results if takes this, tries not to take it everyday - Admits some "tightness" and stiffness in finger joints, pain mostly localized over Thumb CMC - Denies injury, swelling, redness  Health Maintenance: - Due for Flu Shot,  will receive today - Colon CA Screening: Last Colonoscopy 2008 (done by Dr [facility]), results were negative without polyps, good for 10 years. Currently asymptomatic. Family history with Father colon CA. Due for screening test scheduled to see [facility] on 10/02/17  Depression screen [facility] 2/9 09/04/2017 03/02/2016 12/16/2015  Decreased Interest 0 0 0  Down, Depressed, Hopeless 0 0 0  PHQ - 2 Score 0 0 0    Past Medical History:  Diagnosis Date  . Essential hypertension    controlled with medication;   . Family history of colon cancer    father  . Meningioma (HCC)    right side of brain  . Osteoarthritis    Past Surgical History:  Procedure Laterality Date  . BRAIN SURGERY  11/2016   removal of meningioma  . CERVICAL BIOPSY  W/ LOOP ELECTRODE EXCISION  1999  . COLONOSCOPY    . TONSILLECTOMY AND ADENOIDECTOMY     Social History   Social History  . Marital status: Married    Spouse name: N/A  . Number of children: 2  . Years of education: N/A   Occupational History  . Secretary/ Bookkeeper    Social History Main Topics  . Smoking status: Never Smoker  . Smokeless tobacco: Never Used  . Alcohol use No  . Drug use: No  . Sexual activity: Yes    Birth control/ protection: Post-menopausal   Other Topics Concern  . Not on file   Social History Narrative  . No narrative on file   Family History  Problem Relation Age of Onset  .  Hypertension Mother   . Stroke Mother        2012  . Hypertension Father   . Cancer - Colon Father   . Diabetes Brother   . Breast cancer Maternal Aunt 52  . Diabetes Maternal Aunt    Current Outpatient Prescriptions on File Prior to Visit  Medication Sig  . acetaminophen (TYLENOL) 325 MG tablet Take 650 mg by mouth every 6 (six) hours as needed.  . fexofenadine (ALLEGRA) 60 MG tablet Take 60 mg by mouth daily.  . Fish Oil-Cholecalciferol (FISH OIL + D3 PO) Take by mouth.  . fluticasone (FLONASE) 50 MCG/ACT nasal spray   .  hydrochlorothiazide (HYDRODIURIL) 25 MG tablet TAKE 1 TABLET(25 MG) BY MOUTH DAILY  . losartan (COZAAR) 100 MG tablet Take 1 tablet (100 mg total) by mouth daily.  . meloxicam (MOBIC) 7.5 MG tablet TAKE 1 TABLET(7.5 MG) BY MOUTH DAILY  . Multiple Vitamins-Minerals (HAIR/SKIN/NAILS/BIOTIN PO) Take by mouth.   No current facility-administered medications on file prior to visit.     Review of Systems Per HPI unless specifically indicated above     Objective:    BP 136/74 (BP Location: Left Arm, Cuff Size: Normal)   Pulse (!) 57   Temp 98.6 F (37 C) (Oral)   Resp 16   Ht 5\' 2"  (1.575 m)   Wt 184 lb 12.8 oz (83.8 kg)   BMI 33.80 kg/m   Wt Readings from Last 3 Encounters:  09/04/17 184 lb 12.8 oz (83.8 kg)  06/29/17 184 lb (83.5 kg)  05/28/17 182 lb 6.4 oz (82.7 kg)    Physical Exam  Constitutional: She is oriented to person, place, and time. She appears well-developed and well-nourished. No distress.  Well-appearing, comfortable, cooperative  HENT:  Head: Normocephalic and atraumatic.  Mouth/Throat: Oropharynx is clear and moist.  Eyes: Conjunctivae are normal. Right eye exhibits no discharge. Left eye exhibits no discharge.  Neck: Normal range of motion. Neck supple. No thyromegaly present.  Cardiovascular: Normal rate, regular rhythm, normal heart sounds and intact distal pulses.   No murmur heard. Pulmonary/Chest: Effort normal and breath sounds normal. No respiratory distress. She has no wheezes. She has no rales.  Musculoskeletal: Normal range of motion. She exhibits no edema.  Bilateral Hand/Wrist Inspection: Mostly normal appearance except slightly bulky CMC joints symmetrical and slightly inc bulky 1st/2nd MCPs, no edema or erythema. Palpation: Non tender hand / wrist, carpal bones, including MCP, base of thumb. No distinct anatomical snuff box or scaphoid tenderness.  ROM: full active wrist ROM flex / ext, ulnar / radial deviation Special Testing: Negative  Tinel's  median nerve Strength: 5/5 grip, thumb opposition, wrist flex/ext Neurovascular: distally intact  Lymphadenopathy:    She has no cervical adenopathy.  Neurological: She is alert and oriented to person, place, and time.  Skin: Skin is warm and dry. No rash noted. She is not diaphoretic. No erythema.  Psychiatric: She has a normal mood and affect. Her behavior is normal.  Well groomed, good eye contact, normal speech and thoughts  Nursing note and vitals reviewed.   I have personally reviewed the radiology report from Bilateral Hand X-rays on 09/04/17.  CLINICAL DATA:  56 year old female with chronic bilateral thumb pain for years. No new or old injury. Initial encounter.  EXAM: LEFT HAND - COMPLETE 3+ VIEW  COMPARISON:  None.  FINDINGS: Mild degenerative changes first metacarpal-trapezium articulation.  No bony erosion.  No fracture or dislocation.  IMPRESSION: Mild degenerative changes first metacarpal-trapezium articulation.  Electronically Signed   By: Genia Del M.D.   On: 09/04/2017 11:23  ----------------  CLINICAL DATA:  Chronic pain and swelling  EXAM: RIGHT HAND - COMPLETE 3+ VIEW  COMPARISON:  None.  FINDINGS: Frontal, oblique, and lateral views were obtained. There is no fracture or dislocation. There is mild osteoarthritic change in the second, third, and fifth DIP joints. There is also osteoarthritic change in the first carpal-metacarpal and scaphotrapezial joints. There is a benign cystic area in the distal first metacarpal. No erosive changes.  IMPRESSION: Osteoarthritic change in several joints. Benign cystic area in distal first metacarpal. No acute fracture or dislocation.   Electronically Signed   By: Lowella Grip III M.D.   On: 09/04/2017 11:22  Results for orders placed or performed in visit on 06/29/17  Please Note  Result Value Ref Range   Please Note: Comment   IGP,rfx Aptima HPV all pth  Result Value Ref  Range   DIAGNOSIS: Comment    Specimen adequacy: Comment    Performed by: Comment    QC reviewed by: Comment    PAP Smear Comment .    Note: Comment    Test Methodology Comment    PAP Reflex Comment       Assessment & Plan:   Problem List Items Addressed This Visit    Chronic thumb pain, bilateral    Suspected secondary to underlying OA/DJD given [facility] location and known repetitive history of wear and tear. Clinically seems less likely RA - No prior imaging on file - Improved with NSAID, previously on Meloxicam, Ibuprofen, Aleve in past  Plan: 1. Discussion on OA/DJD pathology, dx management complications 2. Check X-rays bilateral hands for baseline X-ray images for dx - reviewed results, called patient, consistent with R>L mild OA/DJD CMC other DIP joints 3. Trial on new rx Diclofenac gel TID for flares PRN - avoid chronic NSAID 4. Tylenol PRN breakthrough 5. Follow-up within 4 months, otherwise sooner if needed      Relevant Medications   diclofenac sodium (VOLTAREN) 1 % GEL   Other Relevant Orders   DG Hand Complete Left (Completed)   DG Hand Complete Right (Completed)   Elevated hemoglobin A1c    Improved with A1c 5.5 (previously had a 5.7 no confirm dx PreDM)  Plan:  1. Not on any therapy currently  2. Encourage improved lifestyle - low carb, low sugar diet, reduce portion size, continue improving regular exercise 3. Follow-up 6-12 mo a1c      Essential hypertension - Primary    Controlled HTN, mild elevated improve on re-check - Home BP readings slightly elevated but within normal No known complications    Plan:  1. Continue HCTZ 25mg  and Losartan 100mg  daily 3. Encourage improved lifestyle - low sodium diet, regular exercise 4. Continue monitor BP outside office, bring readings to next visit, if persistently >140/90 or new symptoms notify office sooner 5. Follow-up 4 months HTN - may need to resume amlodipine or other med if need      Hyperlipidemia     Elevated LDL otherwise normal lipids. Not on statin. Improved lifestyle Last lipid panel 08/2017 Calculated ASCVD 10 yr risk score 3.5% = low  Plan: 1. Reviewed ASCVD risk, and reassuring with low risk despite elevated LDL 2. Future would recommend ASA 81mg  for primary ASCVD risk reduction 3. Encourage improved lifestyle - low carb/cholesterol, reduce portion size, continue improving regular exercise 4. Follow-up      Osteoarthritis    Likely underlying etiology  for bilateral thumb/hand pain Confirmed on X-ray today      Relevant Medications   diclofenac sodium (VOLTAREN) 1 % GEL   Other Relevant Orders   DG Hand Complete Left (Completed)   DG Hand Complete Right (Completed)   Screening for colon cancer    Due for colonoscopy Last done 2008 [facility] Scheduled already for office visit with [facility] 09/2017 for next screening colonoscopy       Other Visit Diagnoses    Needs flu shot       Relevant Orders   Flu Vaccine QUAD 36+ mos IM (Completed)      Meds ordered this encounter  Medications  . diclofenac sodium (VOLTAREN) 1 % GEL    Sig: Apply 2 g topically 3 (three) times daily.    Dispense:  100 g    Refill:  5    Follow up plan: Return in about 4 months (around 01/03/2018) for HTN, Arthritis Hands/Thumbs.  Nobie Putnam, [facility] 09/05/2017, 12:02 AM

## 2017-09-05 NOTE — Assessment & Plan Note (Signed)
Controlled HTN, mild elevated improve on re-check - Home BP readings slightly elevated but within normal No known complications    Plan:  1. Continue HCTZ 25mg  and Losartan 100mg  daily 3. Encourage improved lifestyle - low sodium diet, regular exercise 4. Continue monitor BP outside office, bring readings to next visit, if persistently >140/90 or new symptoms notify office sooner 5. Follow-up 4 months HTN - may need to resume amlodipine or other med if need

## 2017-09-05 NOTE — Assessment & Plan Note (Signed)
Suspected secondary to underlying OA/DJD given [facility] location and known repetitive history of wear and tear. Clinically seems less likely RA - No prior imaging on file - Improved with NSAID, previously on Meloxicam, Ibuprofen, Aleve in past  Plan: 1. Discussion on OA/DJD pathology, dx management complications 2. Check X-rays bilateral hands for baseline X-ray images for dx - reviewed results, called patient, consistent with R>L mild OA/DJD CMC other DIP joints 3. Trial on new rx Diclofenac gel TID for flares PRN - avoid chronic NSAID 4. Tylenol PRN breakthrough 5. Follow-up within 4 months, otherwise sooner if needed

## 2017-09-05 NOTE — Assessment & Plan Note (Signed)
Likely underlying etiology for bilateral thumb/hand pain Confirmed on X-ray today

## 2017-09-10 ENCOUNTER — Ambulatory Visit
Admission: RE | Admit: 2017-09-10 | Discharge: 2017-09-10 | Disposition: A | Discharge status: Home / Self Care | Payer: BLUE CROSS/BLUE SHIELD | Source: Ambulatory Visit | Attending: Neurosurgery | Admitting: Neurosurgery

## 2017-09-10 DIAGNOSIS — Z9889 Other specified postprocedural states: Secondary | ICD-10-CM | POA: Diagnosis not present

## 2017-09-10 DIAGNOSIS — D320 Benign neoplasm of cerebral meninges: Secondary | ICD-10-CM | POA: Insufficient documentation

## 2017-09-10 DIAGNOSIS — D329 Benign neoplasm of meninges, unspecified: Secondary | ICD-10-CM

## 2017-09-10 LAB — POCT I-STAT CREATININE: Creatinine, Ser: 0.7 mg/dL (ref 0.44–1.00)

## 2017-09-10 MED ORDER — GADOBENATE DIMEGLUMINE 529 MG/ML IV SOLN
20.0000 mL | Freq: Once | INTRAVENOUS | Status: AC | PRN
  Administered 2017-09-10: 17 mL via INTRAVENOUS

## 2017-09-18 DIAGNOSIS — Z86018 Personal history of other benign neoplasm: Secondary | ICD-10-CM | POA: Diagnosis not present

## 2017-09-18 DIAGNOSIS — Z9889 Other specified postprocedural states: Secondary | ICD-10-CM | POA: Diagnosis not present

## 2017-10-01 DIAGNOSIS — H9319 Tinnitus, unspecified ear: Secondary | ICD-10-CM | POA: Diagnosis not present

## 2017-10-02 DIAGNOSIS — Z86010 Personal history of colonic polyps: Secondary | ICD-10-CM | POA: Diagnosis not present

## 2017-10-02 DIAGNOSIS — Z01818 Encounter for other preprocedural examination: Secondary | ICD-10-CM | POA: Diagnosis not present

## 2017-10-17 ENCOUNTER — Ambulatory Visit (INDEPENDENT_AMBULATORY_CARE_PROVIDER_SITE_OTHER): Payer: BLUE CROSS/BLUE SHIELD | Admitting: Family Medicine

## 2017-10-17 ENCOUNTER — Ambulatory Visit
Admission: RE | Admit: 2017-10-17 | Discharge: 2017-10-17 | Disposition: A | Discharge status: Home / Self Care | Payer: BLUE CROSS/BLUE SHIELD | Source: Ambulatory Visit | Attending: Family Medicine | Admitting: Family Medicine

## 2017-10-17 ENCOUNTER — Ambulatory Visit (INDEPENDENT_AMBULATORY_CARE_PROVIDER_SITE_OTHER): Payer: BLUE CROSS/BLUE SHIELD

## 2017-10-17 ENCOUNTER — Encounter: Payer: Self-pay | Admitting: Family Medicine

## 2017-10-17 VITALS — BP 146/64 | HR 72 | Temp 99.0°F | Resp 16 | Ht 62.0 in | Wt 182.6 lb

## 2017-10-17 DIAGNOSIS — J189 Pneumonia, unspecified organism: Secondary | ICD-10-CM | POA: Insufficient documentation

## 2017-10-17 DIAGNOSIS — J181 Lobar pneumonia, unspecified organism: Secondary | ICD-10-CM

## 2017-10-17 DIAGNOSIS — R509 Fever, unspecified: Secondary | ICD-10-CM

## 2017-10-17 DIAGNOSIS — J209 Acute bronchitis, unspecified: Secondary | ICD-10-CM

## 2017-10-17 DIAGNOSIS — R05 Cough: Secondary | ICD-10-CM | POA: Diagnosis not present

## 2017-10-17 LAB — POCT INFLUENZA A/B
INFLUENZA A, POC: NEGATIVE
INFLUENZA B, POC: NEGATIVE

## 2017-10-17 MED ORDER — CEFTRIAXONE SODIUM 1 G IJ SOLR
1.0000 g | Freq: Once | INTRAMUSCULAR | Status: AC
  Administered 2017-10-17: 1 g via INTRAMUSCULAR

## 2017-10-17 MED ORDER — BENZONATATE 100 MG PO CAPS: 100.0000 mg | ORAL_CAPSULE | Freq: Three times a day (TID) | ORAL | 0 refills | Status: DC | PRN

## 2017-10-17 MED ORDER — AZITHROMYCIN 250 MG PO TABS: ORAL_TABLET | ORAL | 0 refills | Status: DC

## 2017-10-17 NOTE — Progress Notes (Signed)
Subjective:    Patient ID: [name], female    DOB: [birth year], 56 y.o.   MRN: [id number]  [name] is a 56 y.o. female presenting on 10/17/2017 for Cough (onset week irritation in throat, upset stoamch, threw up, diarrhea, runny nose, drainge, cough, chills bodyache and fever, weak can't sleep due to cough OTC not improving)  Patient presents for a same day appointment.  HPI  FLU-LIKE ILLNESS / ACUTE BRONCHITIS Reports symptoms started 1 week ago last Wednesday with irritated throat drainage and clearing throat "tickle", then worsening over 2-3 days with more temperature changes felt "feverish" subjective did not have thermometer, chills and sweats, and then had productive cough worsening. No known sick contacts - Taking alternating Ibuprofen and Tylenol with moderate relief. Tried Robitussin, Anti-histamine limited relief. Previously using Flonase, and was taking Allegra in AM - Admits one episode of nausea vomiting upset x 1 day then changed to diarrhea for 2-3 days then this resolved - Admits still having "feverish" and chills intermittently with some body and muscle aches - Admits brief episode of headache (worse with coughing) and behind Left eye frontal aspect - Admits poor sleep due to cough - Denies any abdominal pain, active or recurrent nausea vomiting, diarrhea, active headache  Health Maintenance: UTD Flu Shot 09/04/17  Depression screen [facility] 2/9 09/04/2017 03/02/2016 12/16/2015  Decreased Interest 0 0 0  Down, Depressed, Hopeless 0 0 0  PHQ - 2 Score 0 0 0    Social History   Tobacco Use  . Smoking status: Never Smoker  . Smokeless tobacco: Never Used  Substance Use Topics  . Alcohol use: No    Alcohol/week: 0.0 oz  . Drug use: No    Review of Systems Per HPI unless specifically indicated above     Objective:    BP (!) 146/64   Pulse 72   Temp 99 F (37.2 C) (Oral)   Resp 16   Ht 5\' 2"  (1.575 m)   Wt 182 lb 9.6 oz (82.8 kg)   SpO2 98%   BMI  33.40 kg/m   Wt Readings from Last 3 Encounters:  10/17/17 182 lb 9.6 oz (82.8 kg)  09/04/17 184 lb 12.8 oz (83.8 kg)  06/29/17 184 lb (83.5 kg)    Physical Exam  Constitutional: She is oriented to person, place, and time. She appears well-developed and well-nourished. No distress.  Moderately ill sick appearing with frequent cough and slightly hoarse voice, mostly comfortable, cooperative  HENT:  Head: Normocephalic and atraumatic.  Mouth/Throat: Oropharynx is clear and moist.  Frontal / maxillary sinuses non-tender. Nares patent without purulence or edema. Bilateral TMs clear without erythema, effusion or bulging. Oropharynx more posterior pharyngeal drainage without erythema, exudates, edema or asymmetry.  Eyes: Conjunctivae are normal. Right eye exhibits no discharge. Left eye exhibits no discharge.  Neck: Normal range of motion. Neck supple.  Cardiovascular: Normal rate, regular rhythm, normal heart sounds and intact distal pulses.  No murmur heard. Pulmonary/Chest: Effort normal. No respiratory distress. She has no wheezes. She has no rales.  Mild reduced breath sounds, some rhonchi upper airways clear with cough, non focal. No wheezing. Speaks full sentences, frequent cough  Musculoskeletal: Normal range of motion. She exhibits no edema.  Lymphadenopathy:    She has no cervical adenopathy.  Neurological: She is alert and oriented to person, place, and time.  Skin: Skin is warm and dry. No rash noted. She is not diaphoretic. No erythema.  Psychiatric: Her behavior is normal.  Nursing note and vitals reviewed.  Results for orders placed or performed in visit on 10/17/17  POCT Influenza A/B  Result Value Ref Range   Influenza A, POC Negative Negative   Influenza B, POC Negative Negative      Assessment & Plan:   Problem List Items Addressed This Visit    None    Visit Diagnoses    Community acquired pneumonia of right middle lobe of lung ([place])    -  Primary  Presumed  CAP vs bronchitis otherwise healthy patient. May have evolved from prolonged viral URI / bronchitis. Not recent hospitalization or IV antibiotics within past 90 days. - Afebrile, tachycardic, no hypoxia (98% on RA)  Plan: 1. Check STAT Chest X-ray - confirmed R middle lobe pneumonia, reviewed image, called patient back, she will return for Ceftriaxone 1g IM - Start empiric antibiotics with - Ceftriaxone IM 1g x 1 dose in office, Azithromycin PO 500mg  (day 1) then 250mg  daily x 4 days 2. Recommend improve hydration and add Mucinex-DM twice daily for up to 5-7 days if sputum still thick 3. Tessalon Perls for cough TID PRN 4. Tylenol, Ibuprofen PRN fevers 5. RTC within 1 week if not improving, otherwise strict return criteria to go to ED     Relevant Medications   benzonatate (TESSALON) 100 MG capsule   azithromycin (ZITHROMAX Z-PAK) 250 MG tablet   cefTRIAXone (ROCEPHIN) injection 1 g (Start on 10/17/2017  1:20 PM)   Other Relevant Orders   DG Chest 2 View (Completed)   Fever and chills       Relevant Orders   POCT Influenza A/B (Completed)      Meds ordered this encounter  Medications  . benzonatate (TESSALON) 100 MG capsule    Sig: Take 1 capsule (100 mg total) by mouth 3 (three) times daily as needed for cough.    Dispense:  30 capsule    Refill:  0  . azithromycin (ZITHROMAX Z-PAK) 250 MG tablet    Sig: Take 2 tabs (500mg  total) on Day 1. Take 1 tab (250mg ) daily for next 4 days.    Dispense:  6 tablet    Refill:  0  . cefTRIAXone (ROCEPHIN) injection 1 g      Follow up plan: Return in about 2 weeks (around 10/31/2017), or if symptoms worsen or fail to improve, for bronchitis.  Nobie Putnam, DO [facility] 10/17/2017, 12:25 PM

## 2017-10-17 NOTE — Patient Instructions (Addendum)
Thank you for coming to the clinic today.  1. It sounds like you had an Upper Respiratory Virus or Allergies that cause sinus drainage that has settled into a Bronchitis, lower respiratory tract infection.   Possible Flu - TEST was negative  Concern may have bronchitis or pneumonia.  Once you are feeling better, the cough may take a few weeks to fully resolve. - Start Azithromycin Z pak (antibiotic) 2 tabs day 1, then 1 tab x 4 days, complete entire course even if improved - Start Tessalon Perls (cough medicine) take 1 every 8 hours as needed for cough - Recommend OTC Mucinex DM for 1 week or less, to help clear the mucus - Drink plenty of fluids to improve congestion - You may try over the counter Nasal Saline spray (Simply Saline, Ocean Spray) as needed to reduce congestion. - May take Tylenol / Motrin  Chest X-ray today - will call with results  If your symptoms seem to worsen instead of improve over next several days, including significant fever / chills, worsening shortness of breath, worsening wheezing, or nausea / vomiting and can't take medicines - return sooner or go to hospital Emergency Department for more immediate treatment.  Please schedule a follow-up appointment with Dr. Parks Ranger in 1-2 weeks as needed if worsening from Flu / Bronchitis  If you have any other questions or concerns, please feel free to call the clinic or send a message through [facility]. You may also schedule an earlier appointment if necessary.  Additionally, you may be receiving a survey about your experience at our clinic within a few days to 1 week by e-mail or mail. We value your feedback.  Nobie Putnam, DO [place]

## 2017-11-21 ENCOUNTER — Other Ambulatory Visit: Payer: Self-pay | Admitting: Neurosurgery

## 2017-11-21 DIAGNOSIS — Z86018 Personal history of other benign neoplasm: Secondary | ICD-10-CM

## 2017-11-21 DIAGNOSIS — Z9889 Other specified postprocedural states: Principal | ICD-10-CM

## 2017-11-23 DIAGNOSIS — K648 Other hemorrhoids: Secondary | ICD-10-CM | POA: Diagnosis not present

## 2017-11-23 DIAGNOSIS — K640 First degree hemorrhoids: Secondary | ICD-10-CM | POA: Diagnosis not present

## 2017-11-23 DIAGNOSIS — Z86010 Personal history of colonic polyps: Secondary | ICD-10-CM | POA: Diagnosis not present

## 2017-11-23 LAB — HM COLONOSCOPY

## 2018-01-16 ENCOUNTER — Telehealth: Payer: Self-pay | Admitting: Family Medicine

## 2018-01-16 DIAGNOSIS — J111 Influenza due to unidentified influenza virus with other respiratory manifestations: Secondary | ICD-10-CM

## 2018-01-16 DIAGNOSIS — Z20828 Contact with and (suspected) exposure to other viral communicable diseases: Secondary | ICD-10-CM

## 2018-01-16 MED ORDER — OSELTAMIVIR PHOSPHATE 75 MG PO CAPS: 75.0000 mg | ORAL_CAPSULE | Freq: Two times a day (BID) | ORAL | 0 refills | Status: DC

## 2018-01-16 MED ORDER — ONDANSETRON 4 MG PO TBDP: 4.0000 mg | ORAL_TABLET | Freq: Three times a day (TID) | ORAL | 0 refills | Status: DC | PRN

## 2018-01-16 NOTE — Telephone Encounter (Signed)
Last seen 10/2017 for CAP, see note for details.  Now calls with flu-like symptoms with possible influenza exposure, she is s/p flu vaccine this season. See triage note below for details on her symptoms.  Called her back, she was unable to come in to be seen today and is unsure if she can get to clinic tomorrow. We offered for her to go to [facility] or any other local Urgent Care for evaluation, since she states she can tolerate food / liquids but stomach still uneasy had some vomiting, she is trying to stay hydrated and tolerating OTC medicines.  I advised that she would benefit from being evaluated in office, listen to her lungs, may need chest x-ray etc, and since she has declined this medical advice, I do not want to wait longer than 24-48 hours before covering her with anti flu medicine, agreed this time to send in Tamiflu and Zofran ODT to pharmacy, she will start these, she has taken Tamiflu in past and it has worked for her, she was advised when to return to office within 48 hours if not improved. If significant worsening criteria as reviewed she should go to hospital ED or Urgent Care, and if improve then worse again within 7-10 days or longer post flu reviewed risk of pneumonia post flu and she can follow-up  Nobie Putnam, [facility] 01/16/2018, 5:27 PM

## 2018-01-16 NOTE — Telephone Encounter (Signed)
Pt has been nauseous, feelinig bad, feverish last night and doesn't think she could get herself her today to be seen.  She asked if she could have tamiflu or something called in.  Her call back number is [phone]

## 2018-01-16 NOTE — Telephone Encounter (Signed)
Pt called complaining of possible flu like symptoms nauseated, malaise and bodyaches. Fever, chills and vomiting last night. Possibly exposed to the flu via her grandson. She wanted to know if we could send her something over to her pharmacy if you feel like its necessary. Please advise

## 2018-03-06 ENCOUNTER — Ambulatory Visit
Admission: RE | Admit: 2018-03-06 | Discharge: 2018-03-06 | Disposition: A | Discharge status: Home / Self Care | Payer: BLUE CROSS/BLUE SHIELD | Source: Ambulatory Visit | Attending: Neurosurgery | Admitting: Neurosurgery

## 2018-03-06 DIAGNOSIS — Z9889 Other specified postprocedural states: Secondary | ICD-10-CM | POA: Diagnosis not present

## 2018-03-06 DIAGNOSIS — Z86018 Personal history of other benign neoplasm: Secondary | ICD-10-CM | POA: Diagnosis not present

## 2018-03-06 DIAGNOSIS — D329 Benign neoplasm of meninges, unspecified: Secondary | ICD-10-CM | POA: Diagnosis not present

## 2018-03-06 LAB — POCT I-STAT CREATININE: Creatinine, Ser: 0.7 mg/dL (ref 0.44–1.00)

## 2018-03-06 MED ORDER — GADOBENATE DIMEGLUMINE 529 MG/ML IV SOLN
20.0000 mL | Freq: Once | INTRAVENOUS | Status: AC | PRN
  Administered 2018-03-06: 17 mL via INTRAVENOUS

## 2018-03-19 DIAGNOSIS — D329 Benign neoplasm of meninges, unspecified: Secondary | ICD-10-CM | POA: Diagnosis not present

## 2018-06-06 ENCOUNTER — Encounter: Payer: BLUE CROSS/BLUE SHIELD | Admitting: Family Medicine

## 2018-06-17 ENCOUNTER — Encounter: Payer: Self-pay | Admitting: Family Medicine

## 2018-06-17 ENCOUNTER — Ambulatory Visit (INDEPENDENT_AMBULATORY_CARE_PROVIDER_SITE_OTHER): Payer: BLUE CROSS/BLUE SHIELD | Admitting: Family Medicine

## 2018-06-17 VITALS — BP 130/80 | HR 68 | Temp 98.2°F | Resp 16 | Ht 62.0 in | Wt 184.0 lb

## 2018-06-17 DIAGNOSIS — M79642 Pain in left hand: Secondary | ICD-10-CM

## 2018-06-17 DIAGNOSIS — Z0000 Encounter for general adult medical examination without abnormal findings: Secondary | ICD-10-CM | POA: Diagnosis not present

## 2018-06-17 DIAGNOSIS — M150 Primary generalized (osteo)arthritis: Secondary | ICD-10-CM

## 2018-06-17 DIAGNOSIS — E782 Mixed hyperlipidemia: Secondary | ICD-10-CM

## 2018-06-17 DIAGNOSIS — I10 Essential (primary) hypertension: Secondary | ICD-10-CM

## 2018-06-17 DIAGNOSIS — R7309 Other abnormal glucose: Secondary | ICD-10-CM | POA: Diagnosis not present

## 2018-06-17 DIAGNOSIS — Z86018 Personal history of other benign neoplasm: Secondary | ICD-10-CM

## 2018-06-17 DIAGNOSIS — M159 Polyosteoarthritis, unspecified: Secondary | ICD-10-CM

## 2018-06-17 DIAGNOSIS — M8949 Other hypertrophic osteoarthropathy, multiple sites: Secondary | ICD-10-CM

## 2018-06-17 DIAGNOSIS — Z9889 Other specified postprocedural states: Secondary | ICD-10-CM

## 2018-06-17 DIAGNOSIS — M79641 Pain in right hand: Secondary | ICD-10-CM

## 2018-06-17 MED ORDER — MELOXICAM 7.5 MG PO TABS: 7.5000 mg | ORAL_TABLET | Freq: Every day | ORAL | 5 refills | Status: DC | PRN

## 2018-06-17 MED ORDER — HYDROCHLOROTHIAZIDE 25 MG PO TABS: ORAL_TABLET | ORAL | 3 refills | Status: DC

## 2018-06-17 MED ORDER — DICLOFENAC SODIUM 1 % TD GEL: 2.0000 g | Freq: Three times a day (TID) | TRANSDERMAL | 5 refills | Status: AC

## 2018-06-17 MED ORDER — LOSARTAN POTASSIUM 100 MG PO TABS: 100.0000 mg | ORAL_TABLET | Freq: Every day | ORAL | 3 refills | Status: DC

## 2018-06-17 NOTE — Assessment & Plan Note (Addendum)
Due for lipids today fasting Prior elevated LDL otherwise normal lipids. Not on statin. Improved lifestyle Last lipid panel 08/2017 Calculated ASCVD 10 yr risk score 3.5% = low  Plan: Check fasting lipids 1. Reviewed ASCVD risk 2. Future would recommend ASA 81mg  for primary ASCVD risk reduction 3. Encourage improved lifestyle - low carb/cholesterol, reduce portion size, continue improving regular exercise 4. Follow-up yearly

## 2018-06-17 NOTE — Assessment & Plan Note (Signed)
Underlying etiology for bilateral thumb/pain pain X-rays done 08/2017 bilateral confirm See A&P

## 2018-06-17 NOTE — Assessment & Plan Note (Signed)
Prior improved to 5.5, without dx PreDM  Plan:  Check labs today with A1c 1. Not on any therapy currently  2. Encourage improved lifestyle - low carb, low sugar diet, reduce portion size, continue improving regular exercise 3. Follow-up 6-12 mo a1c pending result

## 2018-06-17 NOTE — Assessment & Plan Note (Addendum)
Controlled HTN No known complications      Plan:  1. Continue HCTZ 25mg  and Losartan 100mg  daily - refilled 3. Encourage improved lifestyle - low sodium diet, regular exercise 4. Continue monitor BP outside office, bring readings to next visit, if persistently >140/90 or new symptoms notify office sooner 5. Follow-up 1 year - will check labs today then due next year

## 2018-06-17 NOTE — Assessment & Plan Note (Signed)
Stable chronic problem with hand/thumb pain and stiffness Secondary to OA/DJD given [facility] location and known repetitive history of wear and tear - X-rays 08/2017  Plan: 1. Discussion on OA/DJD pathology, dx management complications 2. Continue 1st line Tylenol regular dosing, refill Diclofenac topical for 2nd line PRN for hands, then 3rd line refill Meloxicam 7.5mg  daily PRN 1-2 weeks at a time if need for flare, may still use but reviewed risks and concern of chronic use 3. Follow-up PRN

## 2018-06-17 NOTE — Patient Instructions (Addendum)
Thank you for coming to the office today.  Keep up the great work!  May take Zantac (ranitidine) 75 or 150 as needed up to twice a day PRN or just daily PRN with heartburn or indigestion.  Refilled all meds  For arthritis - Recommend to start taking Tylenol Extra Strength 500mg  tabs - take 1 to 2 tabs per dose (max 1000mg ) every 6-8 hours for pain (take regularly, don't skip a dose for next 7 days), max 24 hour daily dose is 6 tablets or 3000mg . In the future you can repeat the same everyday Tylenol course for 1-2 weeks at a time.   This is safe to take with anti-inflammatory medicines (Ibuprofen, Advil, Naproxen, Aleve, Meloxicam, Mobic)  - Next can use Diclofenac topical (Voltaren) as needed - this is safe to use  - 3rd line can use Meloxicam 7.5mg  as needed may take regularly but avoid use >2 weeks at a time  Labs TODAY - we will stay tuned.  We will request the colonoscopy report  Return for Flu Shot in 08/2018  DUE for FASTING BLOOD WORK (no food or drink after midnight before the lab appointment, only water or coffee without cream/sugar on the morning of)  SCHEDULE "Lab Only" visit in the morning at the clinic for lab draw in 1 YEAR  - Make sure Lab Only appointment is at about 1 week before your next appointment, so that results will be available  For Lab Results, once available within 2-3 days of blood draw, you can can log in to MyChart online to view your results and a brief explanation. Also, we can discuss results at next follow-up visit.   Please schedule a Follow-up Appointment to: Return in about 1 year (around 06/18/2019) for Annual Physical.  If you have any other questions or concerns, please feel free to call the office or send a message through [place]. You may also schedule an earlier appointment if necessary.  Additionally, you may be receiving a survey about your experience at our office within a few days to 1 week by e-mail or mail. We value your  feedback.  Nobie Putnam, DO [place]

## 2018-06-17 NOTE — Assessment & Plan Note (Signed)
Stable without significant complication Followed by [facility] Neurosurgery Dr Lacinda Axon

## 2018-06-17 NOTE — Progress Notes (Signed)
Subjective:    Patient ID: [name], female    DOB: [birth year], 59 y.o.   MRN: [id number]  [name] is a 57 y.o. female presenting on 06/17/2018 for Annual Exam   HPI   Here for Annual Physical / Due for fasting labs today  CHRONIC HTN: Reports checks BP occasionally, no new concerns. Current Meds - HCTZ 25mg , Losartan 100mg  Reports good compliance, took meds today. Tolerating well, w/o complaints. Drinks some coffee, decaf  Elevated A1c Prior readings 5.7 to 5.5 last done 06/2017. Due for labs today CBGs: Not checking Meds: Never on Reports good compliance. Tolerating well w/o side-effects Currently on ARB Lifestyle: - Diet (Limiting bread and carbs, drinks mostly water) - Exercise (walking regularly) Denies hypoglycemia  Osteoarthritis / Hand Joint Pain - Reviewed background history, known problem with OA/DJD in both hands, had x-rays bilateral 08/2017 - She is R handed - Currently symptoms mostly controlled but has some daily aches and stiffness with hands and other joints. She takes Tylenol, Diclofenac, and Meloxicam. Due for refill on meloxicam - She takes Tylenol up to 2 doses a day with good results, but not 100% resolves symptoms when flare up. She used diclofenac initially some relief but has forgotten about this one, would like refill. She uses Meloxicam 7.5mg  daily for 2-3 days then stop for few days with good relief, can tell if doesn't take it - Admits some "tightness" and stiffness in finger joints, pain mostly localized over Thumb [facility]  Health Maintenance: - History of measles infection before, will not check titer  - Due for Flu Vaccine- will return in October 2019  - Colon CA Screening: Last Colonoscopy 11/23/17 (done by Dr Tami Lin [facility] at [facility]), results were reported negative without polyps, good for 7 years. Currently asymptomatic. Family history with Father colon CA. We will request record, it is not available in [facility]Everywhere   Depression  screen [facility] 2/9 06/17/2018 09/04/2017 03/02/2016  Decreased Interest 0 0 0  Down, Depressed, Hopeless 0 0 0  PHQ - 2 Score 0 0 0    Past Medical History:  Diagnosis Date  . Essential hypertension    controlled with medication;   . Family history of colon cancer    father  . Osteoarthritis    Past Surgical History:  Procedure Laterality Date  . BRAIN SURGERY  11/2016   removal of meningioma  . CERVICAL BIOPSY  W/ LOOP ELECTRODE EXCISION  1999  . COLONOSCOPY    . TONSILLECTOMY AND ADENOIDECTOMY     Social History   Socioeconomic History  . Marital status: Married    Spouse name: Not on file  . Number of children: 2  . Years of education: Not on file  . Highest education level: Not on file  Occupational History  . Occupation: Education officer, community  Social Needs  . Financial resource strain: Not on file  . Food insecurity:    Worry: Not on file    Inability: Not on file  . Transportation needs:    Medical: Not on file    Non-medical: Not on file  Tobacco Use  . Smoking status: Never Smoker  . Smokeless tobacco: Never Used  Substance and Sexual Activity  . Alcohol use: No    Alcohol/week: 0.0 standard drinks  . Drug use: No  . Sexual activity: Yes    Birth control/protection: Post-menopausal  Lifestyle  . Physical activity:    Days per week: Not on file    Minutes per  session: Not on file  . Stress: Not on file  Relationships  . Social connections:    Talks on phone: Not on file    Gets together: Not on file    Attends religious service: Not on file    Active member of club or organization: Not on file    Attends meetings of clubs or organizations: Not on file    Relationship status: Not on file  . Intimate partner violence:    Fear of current or ex partner: Not on file    Emotionally abused: Not on file    Physically abused: Not on file    Forced sexual activity: Not on file  Other Topics Concern  . Not on file  Social History Narrative  . Not on file    Family History  Problem Relation Age of Onset  . Hypertension Mother   . Stroke Mother        2012  . Hypertension Father   . Cancer - Colon Father   . Diabetes Brother   . Breast cancer Maternal Aunt 36  . Diabetes Maternal Aunt    Current Outpatient Medications on File Prior to Visit  Medication Sig  . acetaminophen (TYLENOL) 325 MG tablet Take 650 mg by mouth every 6 (six) hours as needed.  . fexofenadine (ALLEGRA) 60 MG tablet Take 60 mg by mouth daily.  . Fish Oil-Cholecalciferol (FISH OIL + D3 PO) Take by mouth.  . fluticasone (FLONASE) 50 MCG/ACT nasal spray   . Multiple Vitamins-Minerals (HAIR/SKIN/NAILS/BIOTIN PO) Take by mouth.   No current facility-administered medications on file prior to visit.     Review of Systems  Constitutional: Negative for activity change, appetite change, chills, diaphoresis, fatigue and fever.  HENT: Negative for congestion and hearing loss.   Eyes: Negative for visual disturbance.  Respiratory: Negative for apnea, cough, choking, chest tightness, shortness of breath and wheezing.   Cardiovascular: Negative for chest pain, palpitations and leg swelling.  Gastrointestinal: Negative for abdominal pain, anal bleeding, blood in stool, constipation, diarrhea, nausea and vomiting.  Endocrine: Negative for cold intolerance.  Genitourinary: Negative for difficulty urinating, dysuria, frequency and hematuria.  Musculoskeletal: Negative for arthralgias, back pain and neck pain.  Skin: Negative for rash.  Allergic/Immunologic: Negative for environmental allergies.  Neurological: Negative for dizziness, weakness, light-headedness, numbness and headaches.  Hematological: Negative for adenopathy.  Psychiatric/Behavioral: Negative for behavioral problems, dysphoric mood and sleep disturbance. The patient is not nervous/anxious.    Per HPI unless specifically indicated above     Objective:    BP 130/80   Pulse 68   Temp 98.2 F (36.8 C) (Oral)    Resp 16   Ht 5\' 2"  (1.575 m)   Wt 184 lb (83.5 kg)   BMI 33.65 kg/m   Wt Readings from Last 3 Encounters:  06/17/18 184 lb (83.5 kg)  10/17/17 182 lb 9.6 oz (82.8 kg)  09/04/17 184 lb 12.8 oz (83.8 kg)    Physical Exam  Constitutional: She is oriented to person, place, and time. She appears well-developed and well-nourished. No distress.  Well-appearing, comfortable, cooperative  HENT:  Head: Normocephalic and atraumatic.  Mouth/Throat: Oropharynx is clear and moist.  Frontal / maxillary sinuses non-tender. Nares patent without purulence or edema. Bilateral TMs clear without erythema, effusion or bulging. Oropharynx clear without erythema, exudates, edema or asymmetry.  Eyes: Pupils are equal, round, and reactive to light. Conjunctivae and EOM are normal. Right eye exhibits no discharge. Left eye exhibits no discharge.  Neck: Normal range of motion. Neck supple. No thyromegaly present.  Cardiovascular: Normal rate, regular rhythm, normal heart sounds and intact distal pulses.  No murmur heard. Pulmonary/Chest: Effort normal and breath sounds normal. No respiratory distress. She has no wheezes. She has no rales.  Abdominal: Soft. Bowel sounds are normal. She exhibits no distension and no mass. There is no tenderness.  Musculoskeletal: Normal range of motion. She exhibits no edema or tenderness.  Upper / Lower Extremities: - Normal muscle tone, strength bilateral upper extremities 5/5, lower extremities 5/5  Bilateral hands - without obvious deformity, some mild bulky DIP joints 2nd-3rd fingers R>L but minimal, no deviation of fingers, erythema, tender points or reduced ROM. Grip intact  Lymphadenopathy:    She has no cervical adenopathy.  Neurological: She is alert and oriented to person, place, and time.  Distal sensation intact to light touch all extremities  Skin: Skin is warm and dry. No rash noted. She is not diaphoretic. No erythema.  Psychiatric: She has a normal mood and  affect. Her behavior is normal.  Well groomed, good eye contact, normal speech and thoughts  Nursing note and vitals reviewed.     Results for orders placed or performed during the hospital encounter of 03/06/18  I-STAT creatinine  Result Value Ref Range   Creatinine, Ser 0.70 0.44 - 1.00 mg/dL      Assessment & Plan:   Problem List Items Addressed This Visit    Bilateral hand pain    Stable chronic problem with hand/thumb pain and stiffness Secondary to OA/DJD given [facility] location and known repetitive history of wear and tear - X-rays 08/2017  Plan: 1. Discussion on OA/DJD pathology, dx management complications 2. Continue 1st line Tylenol regular dosing, refill Diclofenac topical for 2nd line PRN for hands, then 3rd line refill Meloxicam 7.5mg  daily PRN 1-2 weeks at a time if need for flare, may still use but reviewed risks and concern of chronic use 3. Follow-up PRN      Relevant Medications   meloxicam (MOBIC) 7.5 MG tablet   diclofenac sodium (VOLTAREN) 1 % GEL   Elevated hemoglobin A1c    Prior improved to 5.5, without dx PreDM  Plan:  Check labs today with A1c 1. Not on any therapy currently  2. Encourage improved lifestyle - low carb, low sugar diet, reduce portion size, continue improving regular exercise 3. Follow-up 6-12 mo a1c pending result      Relevant Orders   Hemoglobin A1c   Essential hypertension    Controlled HTN No known complications      Plan:  1. Continue HCTZ 25mg  and Losartan 100mg  daily - refilled 3. Encourage improved lifestyle - low sodium diet, regular exercise 4. Continue monitor BP outside office, bring readings to next visit, if persistently >140/90 or new symptoms notify office sooner 5. Follow-up 1 year - will check labs today then due next year      Relevant Medications   losartan (COZAAR) 100 MG tablet   hydrochlorothiazide (HYDRODIURIL) 25 MG tablet   Other Relevant Orders   COMPLETE METABOLIC PANEL WITH GFR   CBC with  Differential/Platelet   Hyperlipidemia    Due for lipids today fasting Prior elevated LDL otherwise normal lipids. Not on statin. Improved lifestyle Last lipid panel 08/2017 Calculated ASCVD 10 yr risk score 3.5% = low  Plan: Check fasting lipids 1. Reviewed ASCVD risk 2. Future would recommend ASA 81mg  for primary ASCVD risk reduction 3. Encourage improved lifestyle - low carb/cholesterol, reduce portion  size, continue improving regular exercise 4. Follow-up yearly      Relevant Medications   losartan (COZAAR) 100 MG tablet   hydrochlorothiazide (HYDRODIURIL) 25 MG tablet   Other Relevant Orders   Lipid panel   Osteoarthritis    Underlying etiology for bilateral thumb/pain pain X-rays done 08/2017 bilateral confirm See A&P      Relevant Medications   meloxicam (MOBIC) 7.5 MG tablet   diclofenac sodium (VOLTAREN) 1 % GEL   S/P resection of meningioma    Stable without significant complication Followed by [facility] Neurosurgery Dr Lacinda Axon       Other Visit Diagnoses    Annual physical exam    -  Primary   Relevant Orders   COMPLETE METABOLIC PANEL WITH GFR   Lipid panel   CBC with Differential/Platelet   Hemoglobin A1c    Updated Health Maintenance information - return for Flu Vaccine 08/2018 Reviewed recent lab results with patient Encouraged improvement to lifestyle with diet and exercise - Goal of weight loss    Meds ordered this encounter  Medications  . meloxicam (MOBIC) 7.5 MG tablet    Sig: Take 1 tablet (7.5 mg total) by mouth daily as needed for pain.    Dispense:  30 tablet    Refill:  5  . diclofenac sodium (VOLTAREN) 1 % GEL    Sig: Apply 2 g topically 3 (three) times daily.    Dispense:  100 g    Refill:  5  . losartan (COZAAR) 100 MG tablet    Sig: Take 1 tablet (100 mg total) by mouth daily.    Dispense:  90 tablet    Refill:  3  . hydrochlorothiazide (HYDRODIURIL) 25 MG tablet    Sig: TAKE 1 TABLET(25 MG) BY MOUTH DAILY    Dispense:  90 tablet     Refill:  3    Follow up plan: Return in about 1 year (around 06/18/2019) for Annual Physical.  Nobie Putnam, DO [facility] 06/17/2018, 10:08 AM

## 2018-06-18 LAB — CBC WITH DIFFERENTIAL/PLATELET
BASOS ABS: 50 {cells}/uL (ref 0–200)
BASOS PCT: 0.8 %
EOS ABS: 143 {cells}/uL (ref 15–500)
EOS PCT: 2.3 %
HEMATOCRIT: 40.2 % (ref 35.0–45.0)
HEMOGLOBIN: 13.8 g/dL (ref 11.7–15.5)
LYMPHS ABS: 2523 {cells}/uL (ref 850–3900)
MCH: 28.8 pg (ref 27.0–33.0)
MCHC: 34.3 g/dL (ref 32.0–36.0)
MCV: 83.9 fL (ref 80.0–100.0)
MPV: 10.4 fL (ref 7.5–12.5)
Monocytes Relative: 7.3 %
NEUTROS ABS: 3032 {cells}/uL (ref 1500–7800)
Neutrophils Relative %: 48.9 %
Platelets: 278 10*3/uL (ref 140–400)
RBC: 4.79 10*6/uL (ref 3.80–5.10)
RDW: 13.1 % (ref 11.0–15.0)
Total Lymphocyte: 40.7 %
WBC mixed population: 453 cells/uL (ref 200–950)
WBC: 6.2 10*3/uL (ref 3.8–10.8)

## 2018-06-18 LAB — COMPLETE METABOLIC PANEL WITH GFR
AG RATIO: 1.6 (calc) (ref 1.0–2.5)
ALBUMIN MSPROF: 4.4 g/dL (ref 3.6–5.1)
ALKALINE PHOSPHATASE (APISO): 59 U/L (ref 33–130)
ALT: 13 U/L (ref 6–29)
AST: 17 U/L (ref 10–35)
BILIRUBIN TOTAL: 0.5 mg/dL (ref 0.2–1.2)
BUN: 12 mg/dL (ref 7–25)
CHLORIDE: 104 mmol/L (ref 98–110)
CO2: 28 mmol/L (ref 20–32)
Calcium: 9.7 mg/dL (ref 8.6–10.4)
Creat: 0.63 mg/dL (ref 0.50–1.05)
GFR, EST AFRICAN AMERICAN: 115 mL/min/{1.73_m2} (ref >=60)
GFR, Est Non African American: 99 mL/min/{1.73_m2} (ref >=60)
GLOBULIN: 2.7 g/dL (ref 1.9–3.7)
Glucose, Bld: 105 mg/dL — ABNORMAL HIGH (ref 65–99)
POTASSIUM: 4.2 mmol/L (ref 3.5–5.3)
SODIUM: 140 mmol/L (ref 135–146)
TOTAL PROTEIN: 7.1 g/dL (ref 6.1–8.1)

## 2018-06-18 LAB — HEMOGLOBIN A1C
HEMOGLOBIN A1C: 5.7 %{Hb} — AB (ref <5.7)
MEAN PLASMA GLUCOSE: 117 (calc)
eAG (mmol/L): 6.5 (calc)

## 2018-06-18 LAB — LIPID PANEL
CHOLESTEROL: 234 mg/dL — AB (ref <200)
HDL: 63 mg/dL (ref >50)
LDL Cholesterol (Calc): 155 mg/dL (calc) — ABNORMAL HIGH
Non-HDL Cholesterol (Calc): 171 mg/dL (calc) — ABNORMAL HIGH (ref <130)
Total CHOL/HDL Ratio: 3.7 (calc) (ref <5.0)
Triglycerides: 63 mg/dL (ref <150)

## 2018-06-26 ENCOUNTER — Other Ambulatory Visit: Payer: Self-pay | Admitting: Family Medicine

## 2018-06-26 DIAGNOSIS — M159 Polyosteoarthritis, unspecified: Secondary | ICD-10-CM

## 2018-06-26 DIAGNOSIS — I10 Essential (primary) hypertension: Secondary | ICD-10-CM

## 2018-06-26 DIAGNOSIS — R7309 Other abnormal glucose: Secondary | ICD-10-CM

## 2018-06-26 DIAGNOSIS — M8949 Other hypertrophic osteoarthropathy, multiple sites: Secondary | ICD-10-CM

## 2018-06-26 DIAGNOSIS — E782 Mixed hyperlipidemia: Secondary | ICD-10-CM

## 2018-06-26 DIAGNOSIS — M150 Primary generalized (osteo)arthritis: Secondary | ICD-10-CM

## 2018-06-26 DIAGNOSIS — Z0000 Encounter for general adult medical examination without abnormal findings: Secondary | ICD-10-CM

## 2018-06-28 ENCOUNTER — Encounter: Payer: Self-pay | Admitting: Family Medicine

## 2018-07-21 IMAGING — MR MR HEAD WO/W CM
10 of 13 series · 36 of 48 positions shown · IV contrast (multihance)
Comparison: None.

CLINICAL DATA: Left-sided facial droop over the last year.

EXAM:
MRI HEAD WITHOUT AND WITH CONTRAST
TECHNIQUE: Multiplanar, multiecho pulse sequences of the brain and surrounding
structures were obtained without and with intravenous contrast.
CONTRAST:  16mL MULTIHANCE GADOBENATE DIMEGLUMINE 529 MG/ML IV SOLN

[Series 4: DWI · axial · 4.0mm · 0.94mm/px · z∈[-52,+110]mm · 4 of 43 slices shown (1 of 4)]
[im 1/43]
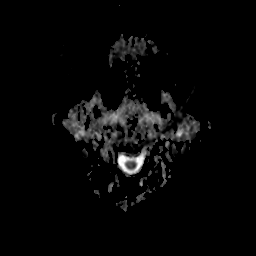
[im 15/43]
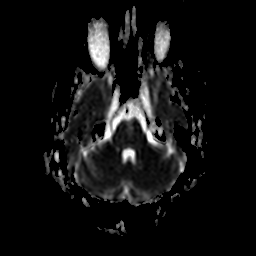
[im 29/43]
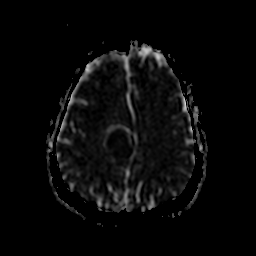
[im 43/43]
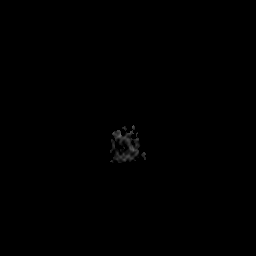

[Series 6: DWI · coronal · 5.0mm · 1.80mm/px · 4 of 39 slices shown (2 of 4)]
[im 1/39]
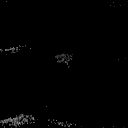
[im 13/39]
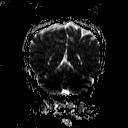
[im 26/39]
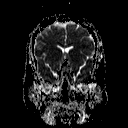
[im 39/39]
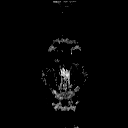

[Series 7: DWI · axial · 4.0mm · 0.94mm/px · z∈[-52,+110]mm · 4 of 43 slices shown (3 of 4)]
[im 1/43]
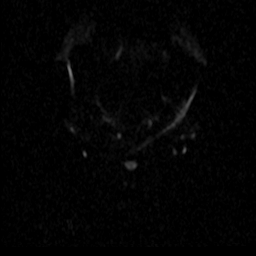
[im 15/43]
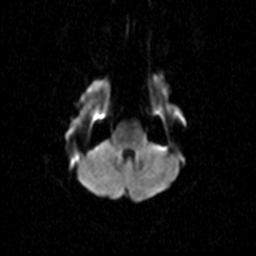
[im 29/43]
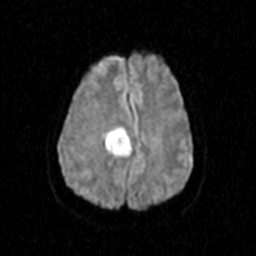
[im 43/43]
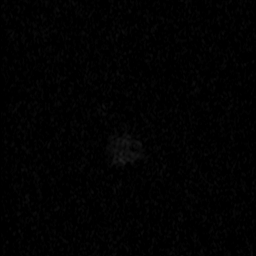

[Series 8: DWI · coronal · 5.0mm · 1.80mm/px · 4 of 37 slices shown (4 of 4)]
[im 1/37]
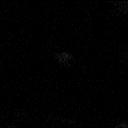
[im 13/37]
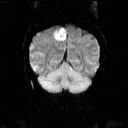
[im 25/37]
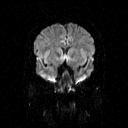
[im 37/37]
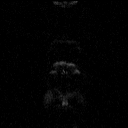

[Series 9: T2 · axial · 5.0mm · 0.45mm/px · z∈[-56,+101]mm · 3 of 26 slices shown (1 of 2)]
[im 1/26]
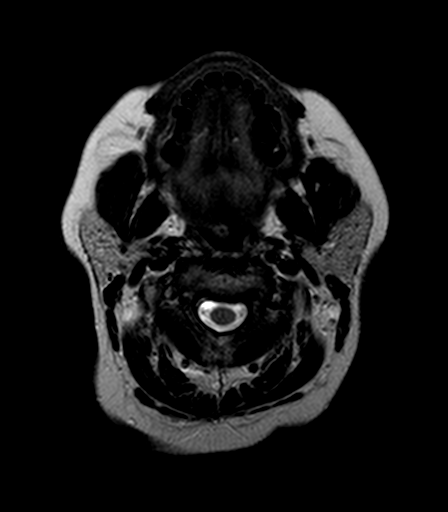
[im 13/26]
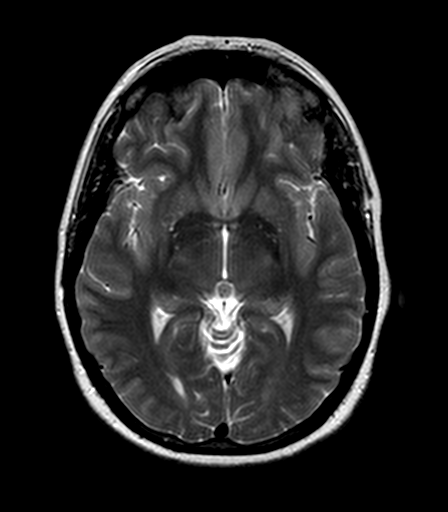
[im 26/26]
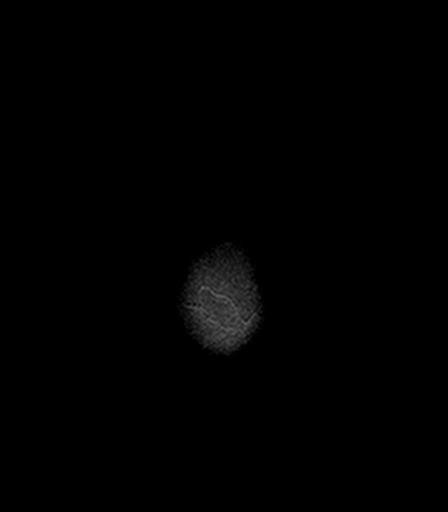

[Series 10: FLAIR · axial · 5.0mm · 0.90mm/px · z∈[-56,+100]mm · 3 of 26 slices shown]
[im 1/26]
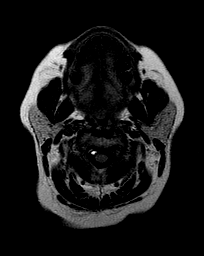
[im 13/26]
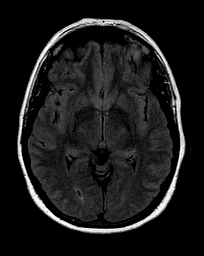
[im 26/26]
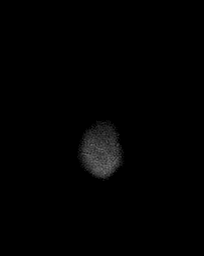

[Series 11: T2 · axial · 5.0mm · 0.45mm/px · z∈[-56,+19]mm · 2 of 26 slices shown (2 of 2)]
[im 1/26]
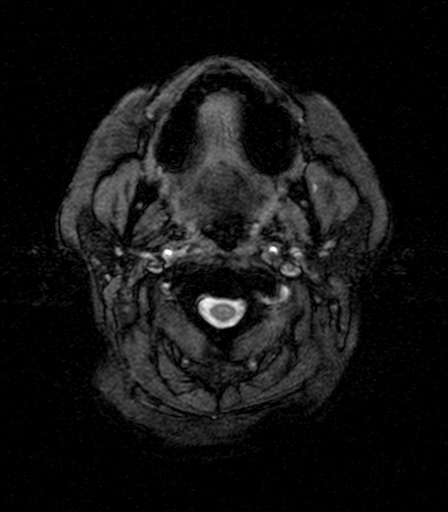
[im 13/26]
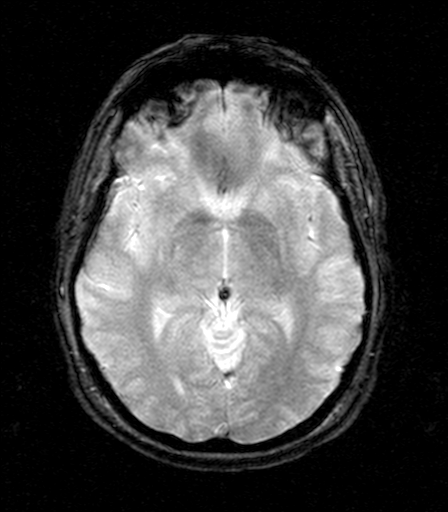

[Series 14: T1 post-contrast · axial · 3.0mm · 0.45mm/px · z∈[-58,+101]mm · 6 of 56 slices shown (1 of 3)]
[im 1/56]
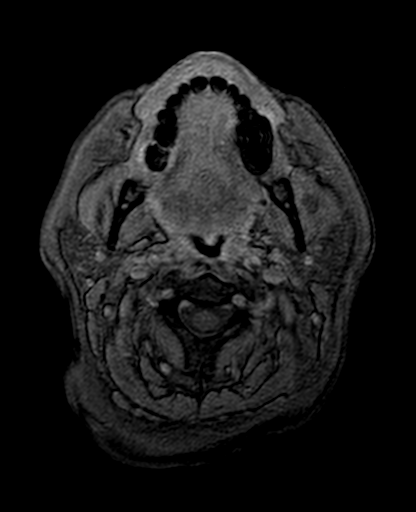
[im 12/56]
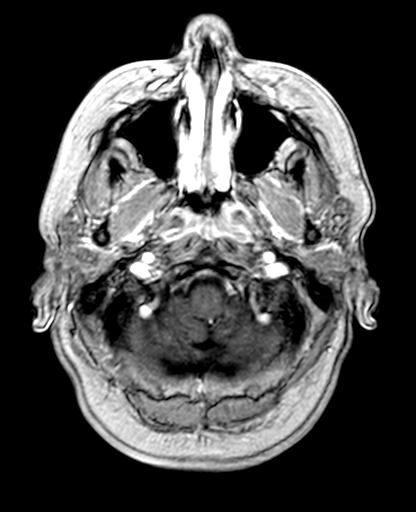
[im 23/56]
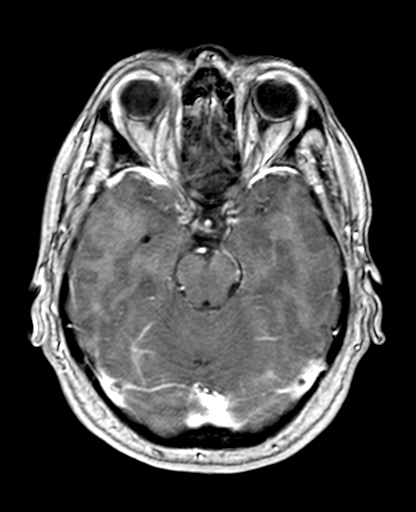
[im 34/56]
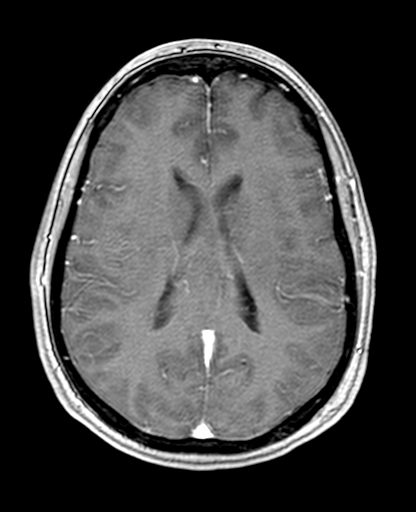
[im 45/56]
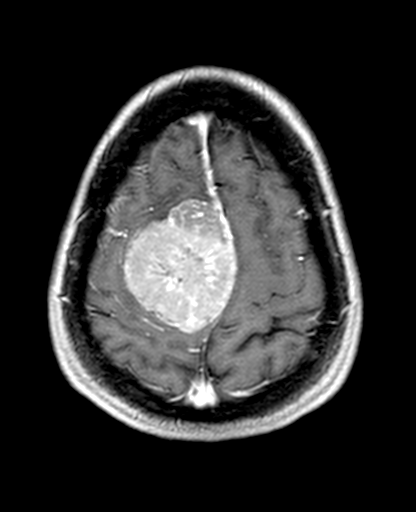
[im 56/56]
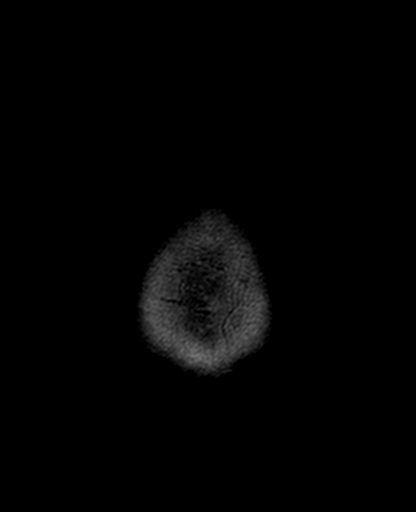

[Series 15: T1 post-contrast · coronal · 5.0mm · 0.45mm/px · 3 of 29 slices shown (2 of 3)]
[im 1/29]
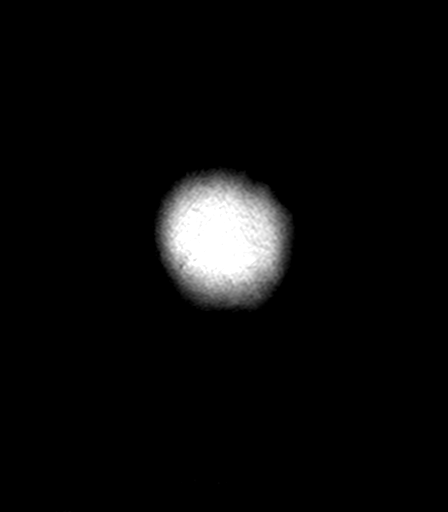
[im 15/29]
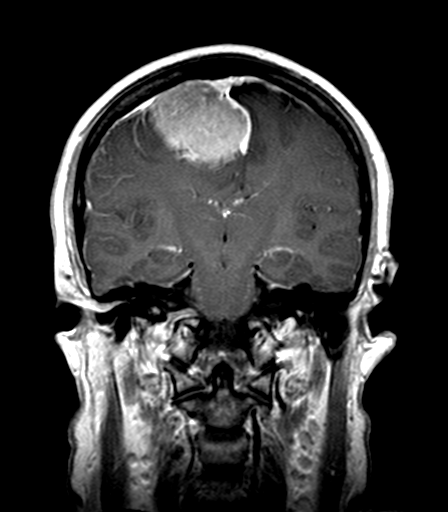
[im 29/29]
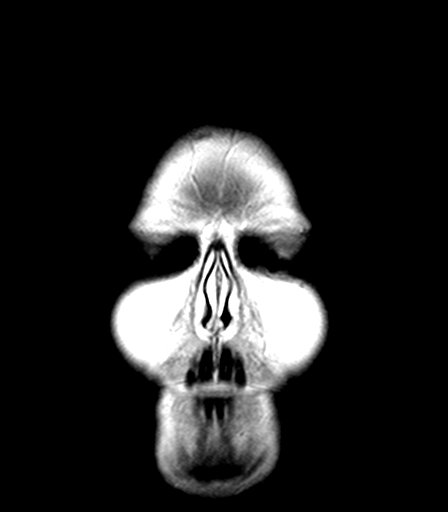

[Series 16: T1 post-contrast · sagittal · 5.0mm · 0.45mm/px · 3 of 28 slices shown (3 of 3)]
[im 1/28]
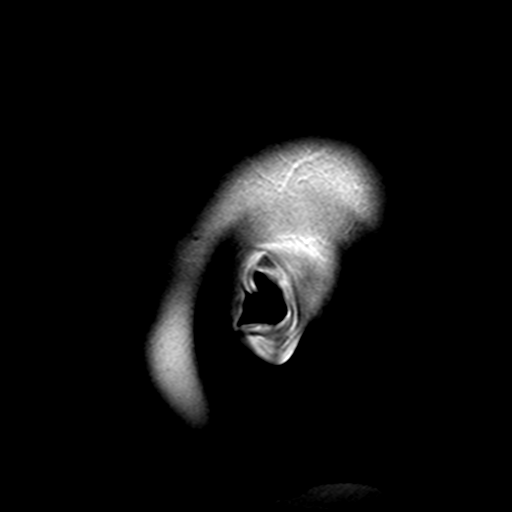
[im 14/28]
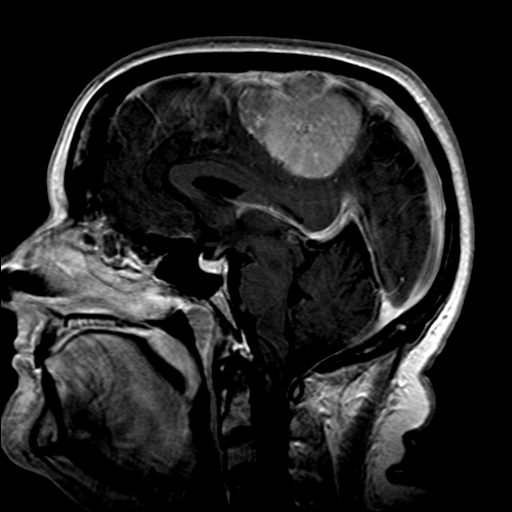
[im 28/28]
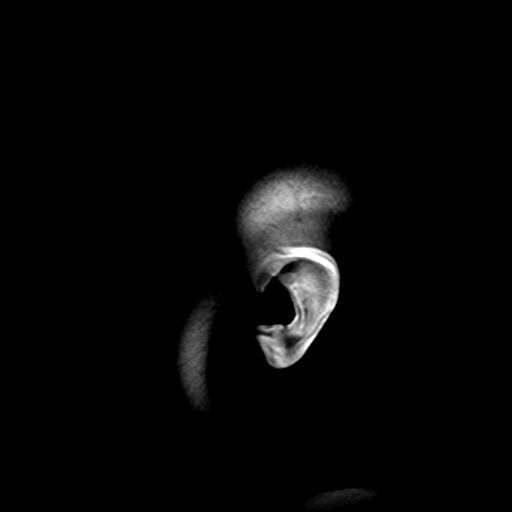

[36 of 48 positions shown; findings below may reference images not displayed]

FINDINGS: Brain: Right para false seen meningioma measuring 6 cm AP, 5 cm
right left and 4.8 cm cephalo caudal, indenting the right
hemisphere, probably anterior to the precentral gyrus. Brain
displacement but no brain edema. Lesion shows central calcification
without necrosis. Definite superior sagittal sinus invasion
occlusion. Posterior aspect of the superior sagittal sinus is
reconstituted by cortical veins. Right-to-left bulging of the falx
measuring 14 mm. No evidence of old or recent ischemic brain
infarction. No intra-axial mass lesion. No hemorrhage. No
hydrocephalus.

Vascular: Major vessels at the base of the brain show flow.

Skull and upper cervical spine: Negative

Sinuses/Orbits: Clear/normal

Other: None
IMPRESSION: 6 x 5 x 4.8 cm right para falcine meningioma at the right posterior
frontal vertex indenting the brain, probably anterior to the
precentral gyrus. Segmental cavernous sinus invasion and occlusion
with reconstituted flow posterior to the lesion.

These results will be called to the ordering clinician or
representative by the Radiologist Assistant, and communication
documented in the PACS or zVision Dashboard.

## 2018-08-12 ENCOUNTER — Other Ambulatory Visit: Payer: Self-pay | Admitting: Neurosurgery

## 2018-08-12 DIAGNOSIS — D329 Benign neoplasm of meninges, unspecified: Secondary | ICD-10-CM

## 2018-08-14 ENCOUNTER — Other Ambulatory Visit: Payer: Self-pay | Admitting: Family Medicine

## 2018-08-14 DIAGNOSIS — I10 Essential (primary) hypertension: Secondary | ICD-10-CM

## 2018-10-08 ENCOUNTER — Ambulatory Visit
Admission: RE | Admit: 2018-10-08 | Discharge: 2018-10-08 | Disposition: A | Discharge status: Home / Self Care | Payer: BLUE CROSS/BLUE SHIELD | Source: Ambulatory Visit | Attending: Neurosurgery | Admitting: Neurosurgery

## 2018-10-08 DIAGNOSIS — D329 Benign neoplasm of meninges, unspecified: Secondary | ICD-10-CM

## 2018-10-08 LAB — POCT I-STAT CREATININE: Creatinine, Ser: 0.6 mg/dL (ref 0.44–1.00)

## 2018-10-08 MED ORDER — GADOBUTROL 1 MMOL/ML IV SOLN
8.0000 mL | Freq: Once | INTRAVENOUS | Status: AC | PRN
  Administered 2018-10-08: 8 mL via INTRAVENOUS

## 2018-10-15 DIAGNOSIS — D329 Benign neoplasm of meninges, unspecified: Secondary | ICD-10-CM | POA: Diagnosis not present

## 2019-01-10 ENCOUNTER — Other Ambulatory Visit: Payer: Self-pay | Admitting: Family Medicine

## 2019-01-10 DIAGNOSIS — M8949 Other hypertrophic osteoarthropathy, multiple sites: Secondary | ICD-10-CM

## 2019-01-10 DIAGNOSIS — M79641 Pain in right hand: Secondary | ICD-10-CM

## 2019-01-10 DIAGNOSIS — M150 Primary generalized (osteo)arthritis: Principal | ICD-10-CM

## 2019-01-10 DIAGNOSIS — M159 Polyosteoarthritis, unspecified: Secondary | ICD-10-CM

## 2019-01-10 DIAGNOSIS — M79642 Pain in left hand: Secondary | ICD-10-CM

## 2019-01-31 ENCOUNTER — Ambulatory Visit
Admission: EM | Admit: 2019-01-31 | Discharge: 2019-01-31 | Disposition: A | Discharge status: Home / Self Care | Payer: BLUE CROSS/BLUE SHIELD | Attending: Family Medicine | Admitting: Family Medicine

## 2019-01-31 ENCOUNTER — Other Ambulatory Visit: Payer: Self-pay

## 2019-01-31 ENCOUNTER — Ambulatory Visit (INDEPENDENT_AMBULATORY_CARE_PROVIDER_SITE_OTHER): Payer: BLUE CROSS/BLUE SHIELD | Admitting: Family Medicine

## 2019-01-31 ENCOUNTER — Ambulatory Visit (INDEPENDENT_AMBULATORY_CARE_PROVIDER_SITE_OTHER): Payer: BLUE CROSS/BLUE SHIELD

## 2019-01-31 ENCOUNTER — Encounter: Payer: Self-pay | Admitting: Family Medicine

## 2019-01-31 DIAGNOSIS — R05 Cough: Secondary | ICD-10-CM | POA: Diagnosis not present

## 2019-01-31 DIAGNOSIS — R5383 Other fatigue: Secondary | ICD-10-CM

## 2019-01-31 DIAGNOSIS — B349 Viral infection, unspecified: Secondary | ICD-10-CM | POA: Diagnosis not present

## 2019-01-31 DIAGNOSIS — R0602 Shortness of breath: Secondary | ICD-10-CM

## 2019-01-31 NOTE — Progress Notes (Signed)
Virtual Visit via Telephone The purpose of this virtual visit is to provide medical care while limiting exposure to the novel coronavirus (COVID19) for both patient and office staff.  Consent was obtained for phone visit:  Yes.   Answered questions that patient had about telehealth interaction:  Yes.   I discussed the limitations, risks, security and privacy concerns of performing an evaluation and management service by telephone. I also discussed with the patient that there may be a patient responsible charge related to this service. The patient expressed understanding and agreed to proceed.  Patient Location: Home Provider Location: [facility] (Office)  ---------------------------------------------------------------------- CC: Shortness of Breath  S: Reviewed [place] telephone note below. I have called patient and gathered additional HPI as follows:  Reports that symptoms started 3 days ago, she felt "tired" and fatigued, she went home to rest, and still felt tired. She felt winded and shortness of breath, even with exertion minor household tasks and chores, laundry, making bed. She felt better yesterday after sleeping overnight, slept well. She felt better today, then returned to work today, she works sedentary job. - Now admits chest feels tight and still short of breath - She says feels similar to prior Pneumonia she had in 2018  Patient is currently working at [employer] office, only potential contact with husband and daughter, in relative isolation Denies any high risk travel to areas of current concern for [place]. Denies any known or suspected exposure to person with or possibly with COVID19.  Admits chest tightness Denies any fevers, chills, sweats, body ache, cough, sinus pain or pressure, headache, abdominal pain, diarrhea, chest pain, syncope  Past Medical History:  Diagnosis Date  . Essential hypertension    controlled with medication;   . Family history of colon  cancer    father  . Osteoarthritis    Social History   Tobacco Use  . Smoking status: Never Smoker  . Smokeless tobacco: Never Used  Substance Use Topics  . Alcohol use: No    Alcohol/week: 0.0 standard drinks  . Drug use: No    Current Outpatient Medications:  .  acetaminophen (TYLENOL) 325 MG tablet, Take 650 mg by mouth every 6 (six) hours as needed., Disp: , Rfl:  .  diclofenac sodium (VOLTAREN) 1 % GEL, Apply 2 g topically 3 (three) times daily., Disp: 100 g, Rfl: 5 .  fexofenadine (ALLEGRA) 60 MG tablet, Take 60 mg by mouth daily., Disp: , Rfl:  .  Fish Oil-Cholecalciferol (FISH OIL + D3 PO), Take by mouth., Disp: , Rfl:  .  fluticasone (FLONASE) 50 MCG/ACT nasal spray, , Disp: , Rfl: 7 .  hydrochlorothiazide (HYDRODIURIL) 25 MG tablet, TAKE 1 TABLET(25 MG) BY MOUTH DAILY, Disp: 90 tablet, Rfl: 3 .  losartan (COZAAR) 100 MG tablet, TAKE 1 TABLET(100 MG) BY MOUTH DAILY, Disp: 90 tablet, Rfl: 1 .  meloxicam (MOBIC) 7.5 MG tablet, TAKE 1 TABLET(7.5 MG) BY MOUTH DAILY AS NEEDED FOR PAIN, Disp: 30 tablet, Rfl: 5 .  Multiple Vitamins-Minerals (HAIR/SKIN/NAILS/BIOTIN PO), Take by mouth., Disp: , Rfl:   Depression screen [facility] 2/9 01/31/2019 06/17/2018 09/04/2017  Decreased Interest 0 0 0  Down, Depressed, Hopeless 0 0 0  PHQ - 2 Score 0 0 0    No flowsheet data found.  -------------------------------------------------------------------------- O: No physical exam performed due to remote telephone encounter.   No results found for this or any previous visit (from the past 2160 hour(s)).  -------------------------------------------------------------------------- A&P:  Problem List Items Addressed This Visit  None    Visit Diagnoses    Shortness of breath    -  Primary     Clinically concerned about patients symptoms, acute onset within past 3 days, mostly shortness of breath easy fatigue on exertion, with chest tightness.  No significant evidence of fever or cough based  on history - LOW risk of COVID19 at this point based on all symptoms - but CANNOT RULE OUT due to community spread and her shortness of breath  No prior history known CAD or MI. She does have cardiovascular risk factors with Hyperlipidemia, Elevated A1c. Does have history of prior atypical chest pain with abnormal exercise stress test per Dr Fletcher [facility] 03/2013, had ischemic EKG changes and later Stress ECHO 05/2013 - do not see results on file.  Plan 1. Due to nature of her shortness of breath - I advised her that I am concerned that she should seek a higher level of care, limited due to virtual visit today - due to cannot rule out potential COVID19 and timing of testing and work-up do not feel it appropriate to bring her to in ambulatory care center office visit today - instead strongly recommended that she seek care at either Urgent Care or Hospital ED for expedited testing, imaging and management - cannot rule out ACS vs PNA vs COVID19  She declines to go to Hospital ED - but agrees to go to Urgent Care - she will go to [place] for further initial evaluation.   No orders of the defined types were placed in this encounter.   Follow-up: As needed if not improve  Patient verbalizes understanding with the above medical recommendations including the limitation of remote medical advice.  Specific follow-up and call-back criteria were given for patient to follow-up or seek medical care more urgently if needed.   - Time spent in direct consultation with patient on phone: 9 minutes  Nobie Putnam, [facility] 01/31/2019, 11:29 AM

## 2019-01-31 NOTE — ED Provider Notes (Signed)
[facility]    CSN: [id number] Arrival date & time: 01/31/19  1309     History   Chief Complaint Chief Complaint  Patient presents with  . Fatigue    HPI MIKERIA VALIN is a 58 y.o. female.   The history is provided by the patient.  URI  Presenting symptoms: cough and fatigue   Severity:  Mild Onset quality:  Sudden Duration:  2 days Timing:  Constant Progression:  Unchanged Chronicity:  New Relieved by:  None tried Ineffective treatments:  None tried Associated symptoms comment:  Shortness of breath   Past Medical History:  Diagnosis Date  . Essential hypertension    controlled with medication;   . Family history of colon cancer    father  . Osteoarthritis     Patient Active Problem List   Diagnosis Date Noted  . Elevated hemoglobin A1c 09/04/2017  . Bilateral hand pain 09/04/2017  . S/P resection of meningioma 05/29/2017  . Screening for colon cancer 05/29/2017  . Screening for breast cancer 05/28/2017  . Hyperlipidemia 05/28/2017  . Decreased hearing 12/16/2015  . Osteoarthritis 06/29/2015  . Chest discomfort 02/14/2013  . Essential hypertension     Past Surgical History:  Procedure Laterality Date  . BRAIN SURGERY  11/2016   removal of meningioma  . CERVICAL BIOPSY  W/ LOOP ELECTRODE EXCISION  1999  . COLONOSCOPY    . TONSILLECTOMY AND ADENOIDECTOMY      OB History    Gravida  2   Para  2   Term  2   Preterm      AB      Living  2     SAB      TAB      Ectopic      Multiple      Live Births  2            Home Medications    Prior to Admission medications   Medication Sig Start Date End Date Taking? Authorizing Provider  acetaminophen (TYLENOL) 325 MG tablet Take 650 mg by mouth every 6 (six) hours as needed.   Yes [provider]  diclofenac sodium (VOLTAREN) 1 % GEL Apply 2 g topically 3 (three) times daily. 06/17/18  Yes Karamalegos, Devonne Doughty, DO  fexofenadine (ALLEGRA) 60 MG tablet Take  60 mg by mouth daily.   Yes [provider]  Fish Oil-Cholecalciferol (FISH OIL + D3 PO) Take by mouth.   Yes [provider]  fluticasone (FLONASE) 50 MCG/ACT nasal spray  02/23/16  Yes [provider]  hydrochlorothiazide (HYDRODIURIL) 25 MG tablet TAKE 1 TABLET(25 MG) BY MOUTH DAILY 06/17/18  Yes Karamalegos, Devonne Doughty, DO  losartan (COZAAR) 100 MG tablet TAKE 1 TABLET(100 MG) BY MOUTH DAILY 08/14/18  Yes Karamalegos, Devonne Doughty, DO  meloxicam (MOBIC) 7.5 MG tablet TAKE 1 TABLET(7.5 MG) BY MOUTH DAILY AS NEEDED FOR PAIN 01/10/19  Yes Karamalegos, Devonne Doughty, DO  Multiple Vitamins-Minerals (HAIR/SKIN/NAILS/BIOTIN PO) Take by mouth.   Yes [provider]    Family History Family History  Problem Relation Age of Onset  . Hypertension Mother   . Stroke Mother        2012  . Hypertension Father   . Cancer - Colon Father   . Diabetes Brother   . Breast cancer Maternal Aunt 42  . Diabetes Maternal Aunt     Social History Social History   Tobacco Use  . Smoking status: Never Smoker  .  Smokeless tobacco: Never Used  Substance Use Topics  . Alcohol use: No    Alcohol/week: 0.0 standard drinks  . Drug use: No     Allergies   Patient has no known allergies.   Review of Systems Review of Systems  Constitutional: Positive for fatigue.  Respiratory: Positive for cough.      Physical Exam Triage Vital Signs ED Triage Vitals  Enc Vitals Group     BP --      Pulse Rate 01/31/19 1322 69     Resp 01/31/19 1322 18     Temp 01/31/19 1322 98.3 F (36.8 C)     Temp src --      SpO2 01/31/19 1322 99 %     Weight 01/31/19 1324 173 lb (78.5 kg)     Height 01/31/19 1324 5\' 2"  (1.575 m)     Head Circumference --      Peak Flow --      Pain Score 01/31/19 1324 0     Pain Loc --      Pain Edu? --      Excl. in [place]? --    No data found.  Updated Vital Signs Pulse 69   Temp 98.3 F (36.8 C)   Resp 18   Ht 5\' 2"  (1.575 m)   Wt 78.5 kg    SpO2 99%   BMI 31.64 kg/m   Visual Acuity Right Eye Distance:   Left Eye Distance:   Bilateral Distance:    Right Eye Near:   Left Eye Near:    Bilateral Near:     Physical Exam Vitals signs and nursing note reviewed.  Constitutional:      General: She is not in acute distress.    Appearance: Normal appearance. She is not ill-appearing, toxic-appearing or diaphoretic.  Cardiovascular:     Rate and Rhythm: Normal rate and regular rhythm.  Pulmonary:     Effort: Pulmonary effort is normal. No respiratory distress.     Breath sounds: Normal breath sounds. No stridor. No wheezing, rhonchi or rales.  Neurological:     Mental Status: She is alert.      UC Treatments / Results  Labs (all labs ordered are listed, but only abnormal results are displayed) Labs Reviewed - No data to display  EKG None  Radiology Dg Chest 2 View  Result Date: 01/31/2019 CLINICAL DATA:  Increasing shortness of breath and chest tightness for 4 days. EXAM: CHEST - 2 VIEW COMPARISON:  PA and lateral chest 10/17/2017 and 02/05/2006. FINDINGS: The lungs are clear. Heart size is normal. No pneumothorax or pleural effusion. No acute or focal bony abnormality. IMPRESSION: No acute disease. Electronically Signed   By: Inge Rise M.D.   On: 01/31/2019 13:57    Procedures Procedures (including critical care time)  Medications Ordered in UC Medications - No data to display  Initial Impression / Assessment and Plan / UC Course  I have reviewed the triage vital signs and the nursing notes.  Pertinent labs & imaging results that were available during my care of the patient were reviewed by me and considered in my medical decision making (see chart for details).      Final Clinical Impressions(s) / UC Diagnoses   Final diagnoses:  Viral syndrome     Discharge Instructions     Rest, fluids, over the counter medications as needed Follow  DHHS guidelines for covid-19     ED Prescriptions     None  1. Chest x-ray result and diagnosis reviewed with patient 2. rx as per orders above; reviewed possible side effects, interactions, risks and benefits  3. Recommend supportive treatment as above 4. Follow-up prn if symptoms worsen or don't improve Controlled Substance Prescriptions [place] Controlled Substance Registry consulted? Not Applicable   Norval Gable, MD 01/31/19 1438

## 2019-01-31 NOTE — ED Triage Notes (Signed)
On Tuesday pt felt tired and did go to work the next 2 days. Was unable to do normal activities like changing the sheets on the bed. Today chest feels heavy and SOB does have a hx of pneumonia and states it feels like that. No fever, chest pain or arm pain. Did have a slight cough yesterday

## 2019-01-31 NOTE — Patient Instructions (Signed)
Please go to [facility] as advised.  I am worried about your symptoms as we discussed - contact if not improving or new concern  If you have any other questions or concerns, please feel free to call the office or send a message through [place]. You may also schedule an earlier appointment if necessary.  Additionally, you may be receiving a survey about your experience at our office within a few days to 1 week by e-mail or mail. We value your feedback.  Nobie Putnam, DO [place]

## 2019-01-31 NOTE — Discharge Instructions (Signed)
Rest, fluids, over the counter medications as needed Follow [place] DHHS guidelines for covid-19

## 2019-06-23 ENCOUNTER — Encounter: Payer: BLUE CROSS/BLUE SHIELD | Admitting: Family Medicine

## 2019-06-25 ENCOUNTER — Other Ambulatory Visit: Payer: Self-pay

## 2019-06-25 ENCOUNTER — Other Ambulatory Visit: Payer: BC Managed Care – PPO

## 2019-06-25 DIAGNOSIS — M8949 Other hypertrophic osteoarthropathy, multiple sites: Secondary | ICD-10-CM

## 2019-06-25 DIAGNOSIS — E782 Mixed hyperlipidemia: Secondary | ICD-10-CM

## 2019-06-25 DIAGNOSIS — I10 Essential (primary) hypertension: Secondary | ICD-10-CM

## 2019-06-25 DIAGNOSIS — Z0000 Encounter for general adult medical examination without abnormal findings: Secondary | ICD-10-CM | POA: Diagnosis not present

## 2019-06-25 DIAGNOSIS — R7309 Other abnormal glucose: Secondary | ICD-10-CM

## 2019-06-25 DIAGNOSIS — M159 Polyosteoarthritis, unspecified: Secondary | ICD-10-CM

## 2019-06-26 ENCOUNTER — Other Ambulatory Visit: Payer: BLUE CROSS/BLUE SHIELD

## 2019-06-26 LAB — HEMOGLOBIN A1C
Hgb A1c MFr Bld: 5.6 % of total Hgb (ref <5.7)
Mean Plasma Glucose: 114 (calc)
eAG (mmol/L): 6.3 (calc)

## 2019-06-26 LAB — CBC WITH DIFFERENTIAL/PLATELET
Absolute Monocytes: 460 cells/uL (ref 200–950)
Basophils Absolute: 59 cells/uL (ref 0–200)
Basophils Relative: 1 %
Eosinophils Absolute: 100 cells/uL (ref 15–500)
Eosinophils Relative: 1.7 %
HCT: 42 % (ref 35.0–45.0)
Hemoglobin: 14.1 g/dL (ref 11.7–15.5)
Lymphs Abs: 2100 cells/uL (ref 850–3900)
MCH: 29.1 pg (ref 27.0–33.0)
MCHC: 33.6 g/dL (ref 32.0–36.0)
MCV: 86.8 fL (ref 80.0–100.0)
MPV: 9.9 fL (ref 7.5–12.5)
Monocytes Relative: 7.8 %
Neutro Abs: 3180 cells/uL (ref 1500–7800)
Neutrophils Relative %: 53.9 %
Platelets: 277 10*3/uL (ref 140–400)
RBC: 4.84 10*6/uL (ref 3.80–5.10)
RDW: 13 % (ref 11.0–15.0)
Total Lymphocyte: 35.6 %
WBC: 5.9 10*3/uL (ref 3.8–10.8)

## 2019-06-26 LAB — COMPLETE METABOLIC PANEL WITH GFR
AG Ratio: 1.7 (calc) (ref 1.0–2.5)
ALT: 15 U/L (ref 6–29)
AST: 19 U/L (ref 10–35)
Albumin: 4.4 g/dL (ref 3.6–5.1)
Alkaline phosphatase (APISO): 43 U/L (ref 37–153)
BUN: 13 mg/dL (ref 7–25)
CO2: 29 mmol/L (ref 20–32)
Calcium: 10 mg/dL (ref 8.6–10.4)
Chloride: 104 mmol/L (ref 98–110)
Creat: 0.73 mg/dL (ref 0.50–0.99)
GFR, Est African American: 104 mL/min/{1.73_m2} (ref >=60)
GFR, Est Non African American: 90 mL/min/{1.73_m2} (ref >=60)
Globulin: 2.6 g/dL (calc) (ref 1.9–3.7)
Glucose, Bld: 111 mg/dL — ABNORMAL HIGH (ref 65–99)
Potassium: 4 mmol/L (ref 3.5–5.3)
Sodium: 141 mmol/L (ref 135–146)
Total Bilirubin: 0.4 mg/dL (ref 0.2–1.2)
Total Protein: 7 g/dL (ref 6.1–8.1)

## 2019-06-26 LAB — LIPID PANEL
Cholesterol: 252 mg/dL — ABNORMAL HIGH (ref <200)
HDL: 64 mg/dL (ref >=50)
LDL Cholesterol (Calc): 170 mg/dL (calc) — ABNORMAL HIGH
Non-HDL Cholesterol (Calc): 188 mg/dL (calc) — ABNORMAL HIGH (ref <130)
Total CHOL/HDL Ratio: 3.9 (calc) (ref <5.0)
Triglycerides: 75 mg/dL (ref <150)

## 2019-06-30 ENCOUNTER — Other Ambulatory Visit: Payer: Self-pay

## 2019-06-30 ENCOUNTER — Encounter: Payer: Self-pay | Admitting: Family Medicine

## 2019-06-30 ENCOUNTER — Other Ambulatory Visit: Payer: Self-pay | Admitting: Family Medicine

## 2019-06-30 ENCOUNTER — Ambulatory Visit (INDEPENDENT_AMBULATORY_CARE_PROVIDER_SITE_OTHER): Payer: BC Managed Care – PPO | Admitting: Family Medicine

## 2019-06-30 VITALS — BP 136/74 | HR 60 | Temp 98.8°F | Resp 16 | Ht 63.0 in | Wt 178.0 lb

## 2019-06-30 DIAGNOSIS — Z0000 Encounter for general adult medical examination without abnormal findings: Secondary | ICD-10-CM

## 2019-06-30 DIAGNOSIS — M79641 Pain in right hand: Secondary | ICD-10-CM

## 2019-06-30 DIAGNOSIS — Z1239 Encounter for other screening for malignant neoplasm of breast: Secondary | ICD-10-CM

## 2019-06-30 DIAGNOSIS — M159 Polyosteoarthritis, unspecified: Secondary | ICD-10-CM

## 2019-06-30 DIAGNOSIS — R7309 Other abnormal glucose: Secondary | ICD-10-CM

## 2019-06-30 DIAGNOSIS — I10 Essential (primary) hypertension: Secondary | ICD-10-CM

## 2019-06-30 DIAGNOSIS — M150 Primary generalized (osteo)arthritis: Secondary | ICD-10-CM | POA: Diagnosis not present

## 2019-06-30 DIAGNOSIS — M8949 Other hypertrophic osteoarthropathy, multiple sites: Secondary | ICD-10-CM

## 2019-06-30 DIAGNOSIS — Z9889 Other specified postprocedural states: Secondary | ICD-10-CM

## 2019-06-30 DIAGNOSIS — Z86018 Personal history of other benign neoplasm: Secondary | ICD-10-CM

## 2019-06-30 DIAGNOSIS — M79642 Pain in left hand: Secondary | ICD-10-CM

## 2019-06-30 DIAGNOSIS — E782 Mixed hyperlipidemia: Secondary | ICD-10-CM

## 2019-06-30 MED ORDER — MELOXICAM 7.5 MG PO TABS: 7.5000 mg | ORAL_TABLET | Freq: Every day | ORAL | 5 refills | Status: DC | PRN

## 2019-06-30 MED ORDER — HYDROCHLOROTHIAZIDE 25 MG PO TABS: ORAL_TABLET | ORAL | 3 refills | Status: DC

## 2019-06-30 NOTE — Progress Notes (Signed)
Subjective:    Patient ID: [name], female    DOB: 1961/10/05, 58 y.o.   MRN: [id number]  [name] is a 58 y.o. female presenting on 06/30/2019 for Annual Exam   HPI    Here for Annual Physical and lab review.  CHRONIC HTN: Reportschecks BP occasionally, no new concerns. Current Meds -HCTZ 25mg , Losartan 100mg  Reports good compliance, took meds today. Tolerating well, w/o complaints. Drinks some coffee, decaf   Elevated A1c / Hyperlipidemia Elevated LDL / BMI >31 Improved A1c to 5.6, previously 5.7 LDL up to 170, not on statin CBGs:Not checking Meds:Never on Reports good compliance. Tolerating well w/o side-effects Currently on ARB Lifestyle: - Diet (Limiting bread and carbs - now stopped potatoes and rice reduced pasta, drinks mostly water. She is limiting fried foods, limiting beef/pork she primarily eats chicken and fish salmon) - Exercise (walking intermittently with some exercise bike and some walking - lately limited) Denies hypoglycemia  Osteoarthritis / Hand Joint Pain - Reviewed background history, known problem with OA/DJD in both hands, had x-rays bilateral 08/2017 - She is R handed - improved joint pain on meloxicam and feels like her joint flares are less with reduced carb intake, she has reduced gluten options in foods as well.  needs refill on meloxicam   Health Maintenance:  - Due for Flu Vaccine- will return in October 2020  -Colon CA Screening:Last Colonoscopy1/18/19(done by Dr Harvie Heck at [facility]), results were reported negative without polyps, good for5years. Currently asymptomatic. Family history with Father colon CA.  Due mammoram - last done 07/05/17, negative.  Cervical ca screening last done 06/29/17 - next due 2021 [facility]  Depression screen [facility] 2/9 06/30/2019 01/31/2019 06/17/2018  Decreased Interest 0 0 0  Down, Depressed, Hopeless 0 0 0  PHQ - 2 Score 0 0 0    Past Medical History:  Diagnosis Date  . Essential  hypertension    controlled with medication;   . Family history of colon cancer    father  . Osteoarthritis    Past Surgical History:  Procedure Laterality Date  . BRAIN SURGERY  11/2016   removal of meningioma  . CERVICAL BIOPSY  W/ LOOP ELECTRODE EXCISION  1999  . COLONOSCOPY    . TONSILLECTOMY AND ADENOIDECTOMY     Social History   Socioeconomic History  . Marital status: Married    Spouse name: Not on file  . Number of children: 2  . Years of education: Not on file  . Highest education level: Not on file  Occupational History  . Occupation: Education officer, community  Social Needs  . Financial resource strain: Not on file  . Food insecurity    Worry: Not on file    Inability: Not on file  . Transportation needs    Medical: Not on file    Non-medical: Not on file  Tobacco Use  . Smoking status: Never Smoker  . Smokeless tobacco: Never Used  Substance and Sexual Activity  . Alcohol use: No    Alcohol/week: 0.0 standard drinks  . Drug use: No  . Sexual activity: Yes    Birth control/protection: Post-menopausal  Lifestyle  . Physical activity    Days per week: Not on file    Minutes per session: Not on file  . Stress: Not on file  Relationships  . Social Herbalist on phone: Not on file    Gets together: Not on file    Attends religious service: Not  on file    Active member of club or organization: Not on file    Attends meetings of clubs or organizations: Not on file    Relationship status: Not on file  . Intimate partner violence    Fear of current or ex partner: Not on file    Emotionally abused: Not on file    Physically abused: Not on file    Forced sexual activity: Not on file  Other Topics Concern  . Not on file  Social History Narrative  . Not on file   Family History  Problem Relation Age of Onset  . Hypertension Mother   . Stroke Mother        2012  . Hypertension Father   . Cancer - Colon Father   . Diabetes Brother   . Breast  cancer Maternal Aunt 62  . Diabetes Maternal Aunt    Current Outpatient Medications on File Prior to Visit  Medication Sig  . acetaminophen (TYLENOL) 325 MG tablet Take 650 mg by mouth every 6 (six) hours as needed.  . diclofenac sodium (VOLTAREN) 1 % GEL Apply 2 g topically 3 (three) times daily.  . fexofenadine (ALLEGRA) 60 MG tablet Take 60 mg by mouth daily.  . Fish Oil-Cholecalciferol (FISH OIL + D3 PO) Take by mouth.  . fluticasone (FLONASE) 50 MCG/ACT nasal spray   . losartan (COZAAR) 100 MG tablet TAKE 1 TABLET(100 MG) BY MOUTH DAILY  . Multiple Vitamins-Minerals (HAIR/SKIN/NAILS/BIOTIN PO) Take by mouth.   No current facility-administered medications on file prior to visit.     Review of Systems  Constitutional: Negative for activity change, appetite change, chills, diaphoresis, fatigue and fever.  HENT: Negative for congestion and hearing loss.   Eyes: Negative for visual disturbance.  Respiratory: Negative for apnea, cough, chest tightness, shortness of breath and wheezing.   Cardiovascular: Negative for chest pain, palpitations and leg swelling.  Gastrointestinal: Negative for abdominal pain, anal bleeding, blood in stool, constipation, diarrhea, nausea and vomiting.  Endocrine: Negative for cold intolerance.  Genitourinary: Negative for difficulty urinating, dysuria, frequency and hematuria.  Musculoskeletal: Negative for arthralgias, back pain and neck pain.  Skin: Negative for rash.  Allergic/Immunologic: Negative for environmental allergies.  Neurological: Negative for dizziness, weakness, light-headedness, numbness and headaches.  Hematological: Negative for adenopathy.  Psychiatric/Behavioral: Negative for behavioral problems, dysphoric mood and sleep disturbance. The patient is not nervous/anxious.    Per HPI unless specifically indicated above       Objective:    BP 136/74 (BP Location: Left Arm, Cuff Size: Normal)   Pulse 60   Temp 98.8 F (37.1 C)  (Oral)   Resp 16   Ht 5\' 3"  (1.6 m)   Wt 178 lb (80.7 kg)   BMI 31.53 kg/m   Wt Readings from Last 3 Encounters:  06/30/19 178 lb (80.7 kg)  01/31/19 173 lb (78.5 kg)  06/17/18 184 lb (83.5 kg)    Physical Exam Vitals signs and nursing note reviewed.  Constitutional:      General: She is not in acute distress.    Appearance: She is well-developed. She is not diaphoretic.     Comments: Well-appearing, comfortable, cooperative  HENT:     Head: Normocephalic and atraumatic.  Eyes:     General:        Right eye: No discharge.        Left eye: No discharge.     Conjunctiva/sclera: Conjunctivae normal.     Pupils: Pupils are equal, round, and  reactive to light.  Neck:     Musculoskeletal: Normal range of motion and neck supple.     Thyroid: No thyromegaly.  Cardiovascular:     Rate and Rhythm: Normal rate and regular rhythm.     Heart sounds: Normal heart sounds. No murmur.  Pulmonary:     Effort: Pulmonary effort is normal. No respiratory distress.     Breath sounds: Normal breath sounds. No wheezing or rales.  Abdominal:     General: Bowel sounds are normal. There is no distension.     Palpations: Abdomen is soft. There is no mass.     Tenderness: There is no abdominal tenderness.  Musculoskeletal: Normal range of motion.        General: No tenderness.     Comments: Upper / Lower Extremities: - Normal muscle tone, strength bilateral upper extremities 5/5, lower extremities 5/5  Lymphadenopathy:     Cervical: No cervical adenopathy.  Skin:    General: Skin is warm and dry.     Findings: No erythema or rash.  Neurological:     Mental Status: She is alert and oriented to person, place, and time.     Comments: Distal sensation intact to light touch all extremities  Psychiatric:        Behavior: Behavior normal.     Comments: Well groomed, good eye contact, normal speech and thoughts     Recent Labs    06/25/19 0917  HGBA1C 5.6     Results for orders placed or  performed in visit on 06/25/19  Lipid panel  Result Value Ref Range   Cholesterol 252 (H) <200 mg/dL   HDL 64 > OR = 50 mg/dL   Triglycerides 75 <150 mg/dL   LDL Cholesterol (Calc) 170 (H) mg/dL (calc)   Total CHOL/HDL Ratio 3.9 <5.0 (calc)   Non-HDL Cholesterol (Calc) 188 (H) <130 mg/dL (calc)  COMPLETE METABOLIC PANEL WITH GFR  Result Value Ref Range   Glucose, Bld 111 (H) 65 - 99 mg/dL   BUN 13 7 - 25 mg/dL   Creat 0.73 0.50 - 0.99 mg/dL   GFR, Est Non African American 90 > OR = 60 mL/min/1.72m2   GFR, Est African American 104 > OR = 60 mL/min/1.56m2   BUN/Creatinine Ratio NOT APPLICABLE 6 - 22 (calc)   Sodium 141 135 - 146 mmol/L   Potassium 4.0 3.5 - 5.3 mmol/L   Chloride 104 98 - 110 mmol/L   CO2 29 20 - 32 mmol/L   Calcium 10.0 8.6 - 10.4 mg/dL   Total Protein 7.0 6.1 - 8.1 g/dL   Albumin 4.4 3.6 - 5.1 g/dL   Globulin 2.6 1.9 - 3.7 g/dL (calc)   AG Ratio 1.7 1.0 - 2.5 (calc)   Total Bilirubin 0.4 0.2 - 1.2 mg/dL   Alkaline phosphatase (APISO) 43 37 - 153 U/L   AST 19 10 - 35 U/L   ALT 15 6 - 29 U/L  CBC with Differential/Platelet  Result Value Ref Range   WBC 5.9 3.8 - 10.8 Thousand/uL   RBC 4.84 3.80 - 5.10 Million/uL   Hemoglobin 14.1 11.7 - 15.5 g/dL   HCT 42.0 35.0 - 45.0 %   MCV 86.8 80.0 - 100.0 fL   MCH 29.1 27.0 - 33.0 pg   MCHC 33.6 32.0 - 36.0 g/dL   RDW 13.0 11.0 - 15.0 %   Platelets 277 140 - 400 Thousand/uL   MPV 9.9 7.5 - 12.5 fL   Neutro Abs 3,180 1,500 -  7,800 cells/uL   Lymphs Abs 2,100 850 - 3,900 cells/uL   Absolute Monocytes 460 200 - 950 cells/uL   Eosinophils Absolute 100 15 - 500 cells/uL   Basophils Absolute 59 0 - 200 cells/uL   Neutrophils Relative % 53.9 %   Total Lymphocyte 35.6 %   Monocytes Relative 7.8 %   Eosinophils Relative 1.7 %   Basophils Relative 1.0 %  Hemoglobin A1c  Result Value Ref Range   Hgb A1c MFr Bld 5.6 <5.7 % of total Hgb   Mean Plasma Glucose 114 (calc)   eAG (mmol/L) 6.3 (calc)      Assessment & Plan:    Problem List Items Addressed This Visit    Bilateral hand pain   Relevant Medications   meloxicam (MOBIC) 7.5 MG tablet   Elevated hemoglobin A1c    Stable a1c up to 5.6, previously 5.5 to 5.7, without dx PreDM  Plan:  1. Not on any therapy currently  2. Encourage improved lifestyle - low carb, low sugar diet, reduce portion size, continue improving regular exercise 3. Follow-up 6 mo      Essential hypertension    Controlled HTN No known complications      Plan:  1. Continue HCTZ 25mg  and Losartan 100mg  daily - refilled 3. Encourage improved lifestyle - low sodium diet, regular exercise 4. Continue monitor BP outside office, bring readings to next visit, if persistently >140/90 or new symptoms notify office sooner      Relevant Medications   hydrochlorothiazide (HYDRODIURIL) 25 MG tablet   Hyperlipidemia    Elevated ldl not on statin, improving lifestyle  Plan: 1. Based on ASCVD risk score 3.5% not indicated for statin, however consider in near future if persistent elevated ldl >180 2. Encourage improved lifestyle - low carb/cholesterol, reduce portion size, continue improving regular exercise 3. Follow-up repeat in 6 months       Relevant Medications   hydrochlorothiazide (HYDRODIURIL) 25 MG tablet   Osteoarthritis    Stable chronic, multiple joints Improved on meds Improved with some reduce carb diet Refill meloxicam, use diclofenac prn      Relevant Medications   meloxicam (MOBIC) 7.5 MG tablet   S/P resection of meningioma   Screening for breast cancer   Relevant Orders   MM DIGITAL SCREENING BILATERAL    Other Visit Diagnoses    Annual physical exam    -  Primary       Updated Health Maintenance information - ordered mammogram to schedule - next year pap smear 2021 per [facility] Reviewed recent lab results with patient Encouraged improvement to lifestyle with diet and exercise - Goal of weight loss   Meds ordered this encounter  Medications  .  hydrochlorothiazide (HYDRODIURIL) 25 MG tablet    Sig: TAKE 1 TABLET(25 MG) BY MOUTH DAILY    Dispense:  90 tablet    Refill:  3  . meloxicam (MOBIC) 7.5 MG tablet    Sig: Take 1 tablet (7.5 mg total) by mouth daily as needed for pain.    Dispense:  30 tablet    Refill:  5    Follow up plan: Return in about 6 months (around 12/31/2019) for 6 month follow-up HLD, HTN lab results.   Future labs ordered 6 months - fasting lab Lipid, A1c, TSH  Nobie Putnam, DO [facility] 06/30/2019, 9:20 AM

## 2019-06-30 NOTE — Assessment & Plan Note (Signed)
Stable a1c up to 5.6, previously 5.5 to 5.7, without dx PreDM  Plan:  1. Not on any therapy currently  2. Encourage improved lifestyle - low carb, low sugar diet, reduce portion size, continue improving regular exercise 3. Follow-up 6 mo

## 2019-06-30 NOTE — Assessment & Plan Note (Signed)
Stable chronic, multiple joints Improved on meds Improved with some reduce carb diet Refill meloxicam, use diclofenac prn

## 2019-06-30 NOTE — Assessment & Plan Note (Signed)
Controlled HTN No known complications      Plan:  1. Continue HCTZ 25mg and Losartan 100mg daily - refilled 3. Encourage improved lifestyle - low sodium diet, regular exercise 4. Continue monitor BP outside office, bring readings to next visit, if persistently >140/90 or new symptoms notify office sooner 

## 2019-06-30 NOTE — Assessment & Plan Note (Signed)
Elevated ldl not on statin, improving lifestyle  Plan: 1. Based on ASCVD risk score 3.5% not indicated for statin, however consider in near future if persistent elevated ldl >180 2. Encourage improved lifestyle - low carb/cholesterol, reduce portion size, continue improving regular exercise 3. Follow-up repeat in 6 months

## 2019-06-30 NOTE — Patient Instructions (Addendum)
Thank you for coming to the office today.  Please schedule and return for a NURSE ONLY VISIT for VACCINE - Approximately around October 5 - Need Regular Dose Flu Vaccine  ---------------------------  For Mammogram screening for breast cancer   Call the [facility] below anytime to schedule your own appointment now that order has been placed.  [address] [place], [place] [address] Phone: [phone]  ----------  Next pap smear due in 06/2020 - Dalia Heading - at [facility] (no food or drink after midnight before the lab appointment, only water or coffee without cream/sugar on the morning of)  SCHEDULE "Lab Only" visit in the morning at the clinic for lab draw in 6 MONTHS   - Make sure Lab Only appointment is at about 1 week before your next appointment, so that results will be available  For Lab Results, once available within 2-3 days of blood draw, you can can log in to MyChart online to view your results and a brief explanation. Also, we can discuss results at next follow-up visit.   Please schedule a Follow-up Appointment to: Return in about 6 months (around 12/31/2019) for 6 month follow-up HLD, HTN lab results.  If you have any other questions or concerns, please feel free to call the office or send a message through [place]. You may also schedule an earlier appointment if necessary.  Additionally, you may be receiving a survey about your experience at our office within a few days to 1 week by e-mail or mail. We value your feedback.  Nobie Putnam, DO [place]

## 2019-07-03 ENCOUNTER — Other Ambulatory Visit: Payer: Self-pay | Admitting: Neurosurgery

## 2019-07-03 DIAGNOSIS — D329 Benign neoplasm of meninges, unspecified: Secondary | ICD-10-CM

## 2019-07-24 ENCOUNTER — Other Ambulatory Visit: Payer: Self-pay | Admitting: Family Medicine

## 2019-07-24 DIAGNOSIS — I10 Essential (primary) hypertension: Secondary | ICD-10-CM

## 2019-08-20 ENCOUNTER — Ambulatory Visit (INDEPENDENT_AMBULATORY_CARE_PROVIDER_SITE_OTHER): Payer: BC Managed Care – PPO

## 2019-08-20 ENCOUNTER — Other Ambulatory Visit: Payer: Self-pay

## 2019-08-20 DIAGNOSIS — Z23 Encounter for immunization: Secondary | ICD-10-CM

## 2019-08-20 NOTE — Progress Notes (Signed)
Flu

## 2019-11-12 ENCOUNTER — Ambulatory Visit
Admission: RE | Admit: 2019-11-12 | Discharge: 2019-11-12 | Disposition: A | Discharge status: Home / Self Care | Payer: BC Managed Care – PPO | Source: Ambulatory Visit | Attending: Neurosurgery | Admitting: Neurosurgery

## 2019-11-12 ENCOUNTER — Other Ambulatory Visit: Payer: Self-pay

## 2019-11-12 DIAGNOSIS — D329 Benign neoplasm of meninges, unspecified: Secondary | ICD-10-CM

## 2019-11-12 LAB — POCT I-STAT CREATININE: Creatinine, Ser: 0.7 mg/dL (ref 0.44–1.00)

## 2019-11-12 MED ORDER — GADOBUTROL 1 MMOL/ML IV SOLN
8.0000 mL | Freq: Once | INTRAVENOUS | Status: AC | PRN
  Administered 2019-11-12: 11:00:00 8 mL via INTRAVENOUS

## 2019-11-18 DIAGNOSIS — D329 Benign neoplasm of meninges, unspecified: Secondary | ICD-10-CM | POA: Diagnosis not present

## 2019-12-31 ENCOUNTER — Ambulatory Visit (INDEPENDENT_AMBULATORY_CARE_PROVIDER_SITE_OTHER): Payer: BC Managed Care – PPO | Admitting: Family Medicine

## 2019-12-31 ENCOUNTER — Other Ambulatory Visit: Payer: Self-pay | Admitting: Family Medicine

## 2019-12-31 ENCOUNTER — Encounter: Payer: Self-pay | Admitting: Family Medicine

## 2019-12-31 ENCOUNTER — Other Ambulatory Visit: Payer: Self-pay

## 2019-12-31 VITALS — BP 142/69 | HR 67 | Temp 97.5°F | Resp 16 | Ht 63.0 in | Wt 182.0 lb

## 2019-12-31 DIAGNOSIS — E782 Mixed hyperlipidemia: Secondary | ICD-10-CM | POA: Diagnosis not present

## 2019-12-31 DIAGNOSIS — R7309 Other abnormal glucose: Secondary | ICD-10-CM | POA: Diagnosis not present

## 2019-12-31 DIAGNOSIS — M8949 Other hypertrophic osteoarthropathy, multiple sites: Secondary | ICD-10-CM

## 2019-12-31 DIAGNOSIS — M159 Polyosteoarthritis, unspecified: Secondary | ICD-10-CM

## 2019-12-31 DIAGNOSIS — I10 Essential (primary) hypertension: Secondary | ICD-10-CM

## 2019-12-31 DIAGNOSIS — M19041 Primary osteoarthritis, right hand: Secondary | ICD-10-CM | POA: Insufficient documentation

## 2019-12-31 DIAGNOSIS — M25541 Pain in joints of right hand: Secondary | ICD-10-CM

## 2019-12-31 DIAGNOSIS — M25441 Effusion, right hand: Secondary | ICD-10-CM

## 2019-12-31 DIAGNOSIS — Z0000 Encounter for general adult medical examination without abnormal findings: Secondary | ICD-10-CM

## 2019-12-31 NOTE — Assessment & Plan Note (Signed)
Check fasting lipid  Plan: 1. Previously Based on ASCVD risk score 3.5% not indicated for statin, however consider in near future if persistent elevated ldl >180 2. Encourage improved lifestyle - low carb/cholesterol, reduce portion size, continue improving regular exercise 3. Follow-up repeat in 6 months

## 2019-12-31 NOTE — Progress Notes (Signed)
Subjective:    Patient ID: [name], female    DOB: 1961-05-26, 59 y.o.   MRN: [id number]  [name] is a 59 y.o. female presenting on 12/31/2019 for Hyperlipidemia   HPI   CHRONIC HTN: Reportschecks BP occasionally, no new concerns. Current Meds -HCTZ 25mg , Losartan 100mg  Reports good compliance, took meds today. Tolerating well, w/o complaints. Drinks some coffee, decaf  Elevated A1c / Hyperlipidemia Elevated LDL / BMI >32 Previously A1c 5.5 to 5.6. Due for repeat lab now. Previously 06/2019, LDL up to 170, not on statin CBGs:Not checking Meds:Never on Reports good compliance. Tolerating well w/o side-effects Currently on ARB Lifestyle: - Diet (Significant limiting carbs and starches, no fried foods, drinking mostly water - now stopped eating red meat and processed carbs because these have affected her joint pain worse) - Exercise (walking intermittently with some exercise bike and some walking) Denies hypoglycemia  Osteoarthritis / Hand Joint Pain - Reviewed background history, knownproblem with OA/DJD in both hands, had x-rays bilateral 08/2017 Right Index Finger - Describes swelling and tenderness in distal R index finger. Says onset for few months. Worse with grip and opening jar. - See above, reduced red meat and processed carbs/starches - seems to flare up joint pain   Health Maintenance:  Due mammoram - last done 07/05/17, negative.  Depression screen [facility] 2/9 12/31/2019 06/30/2019 01/31/2019  Decreased Interest 0 0 0  Down, Depressed, Hopeless 0 0 0  PHQ - 2 Score 0 0 0    Social History   Tobacco Use  . Smoking status: Never Smoker  . Smokeless tobacco: Never Used  Substance Use Topics  . Alcohol use: No    Alcohol/week: 0.0 standard drinks  . Drug use: No    Review of Systems Per HPI unless specifically indicated above     Objective:    BP (!) 142/69   Pulse 67   Temp (!) 97.5 F (36.4 C) (Oral)   Resp 16   Ht 5\' 3"  (1.6 m)    Wt 182 lb (82.6 kg)   BMI 32.24 kg/m   Wt Readings from Last 3 Encounters:  12/31/19 182 lb (82.6 kg)  06/30/19 178 lb (80.7 kg)  01/31/19 173 lb (78.5 kg)    Physical Exam Vitals and nursing note reviewed.  Constitutional:      General: She is not in acute distress.    Appearance: She is well-developed. She is not diaphoretic.     Comments: Well-appearing, comfortable, cooperative  HENT:     Head: Normocephalic and atraumatic.  Eyes:     General:        Right eye: No discharge.        Left eye: No discharge.     Conjunctiva/sclera: Conjunctivae normal.  Neck:     Thyroid: No thyromegaly.  Cardiovascular:     Rate and Rhythm: Normal rate and regular rhythm.     Heart sounds: Normal heart sounds. No murmur.  Pulmonary:     Effort: Pulmonary effort is normal. No respiratory distress.     Breath sounds: Normal breath sounds. No wheezing or rales.  Musculoskeletal:        General: Normal range of motion.     Cervical back: Normal range of motion and neck supple.     Comments: Right 2nd finger with DIP bulky, mild tender  Lymphadenopathy:     Cervical: No cervical adenopathy.  Skin:    General: Skin is warm and dry.  Findings: No erythema or rash.  Neurological:     Mental Status: She is alert and oriented to person, place, and time.  Psychiatric:        Behavior: Behavior normal.     Comments: Well groomed, good eye contact, normal speech and thoughts    Results for orders placed or performed in visit on 12/31/19  TSH  Result Value Ref Range   TSH 2.23 0.40 - 4.50 mIU/L  T4, free  Result Value Ref Range   Free T4 1.0 0.8 - 1.8 ng/dL      Assessment & Plan:   Problem List Items Addressed This Visit    Osteoarthritis of right index finger   Osteoarthritis   Hyperlipidemia - Primary    Check fasting lipid  Plan: 1. Previously Based on ASCVD risk score 3.5% not indicated for statin, however consider in near future if persistent elevated ldl >180 2. Encourage  improved lifestyle - low carb/cholesterol, reduce portion size, continue improving regular exercise 3. Follow-up repeat in 6 months      Relevant Orders   Lipid panel   TSH (Completed)   T4, free   Essential hypertension    Controlled HTN No known complications      Plan:  1. Continue HCTZ 25mg  and Losartan 100mg  daily - refilled 3. Encourage improved lifestyle - low sodium diet, regular exercise 4. Continue monitor BP outside office, bring readings to next visit, if persistently >140/90 or new symptoms notify office sooner      Relevant Orders   Lipid panel   Elevated hemoglobin A1c    Previous stable A1c  Plan:  1. Not on any therapy currently  2. Encourage improved lifestyle - low carb, low sugar diet, reduce portion size, continue improving regular exercise 3. Follow-up 6 mo      Relevant Orders   Hemoglobin A1c    Other Visit Diagnoses    Joint pain in fingers of right hand       Relevant Orders   Uric acid   Swelling of finger joint, right       Relevant Orders   Uric acid      Ordered mammogram screening.  #OA/DJD, R index finger DIP - Re-try diclofenac topical TID PRN Can use meloxicam Caution avoid repetitive activity Future consider ortho Check uric acid due to association between worse joint pain w/ inc red meat consumption and gout triggers  No orders of the defined types were placed in this encounter.     Follow up plan: Return in about 6 months (around 06/29/2020) for Annual Physical.  Future labs ordered for 06/2020  Nobie Putnam, [facility] 12/31/2019, 8:17 AM

## 2019-12-31 NOTE — Assessment & Plan Note (Signed)
Controlled HTN No known complications      Plan:  1. Continue HCTZ 25mg and Losartan 100mg daily - refilled 3. Encourage improved lifestyle - low sodium diet, regular exercise 4. Continue monitor BP outside office, bring readings to next visit, if persistently >140/90 or new symptoms notify office sooner 

## 2019-12-31 NOTE — Assessment & Plan Note (Signed)
Previous stable A1c  Plan:  1. Not on any therapy currently  2. Encourage improved lifestyle - low carb, low sugar diet, reduce portion size, continue improving regular exercise 3. Follow-up 6 mo

## 2019-12-31 NOTE — Patient Instructions (Addendum)
Thank you for coming to the office today.  Check your records. I think you are still due for mammogram. Call them to check out status.  For Mammogram screening for breast cancer   Call the [facility] below anytime to schedule your own appointment now that order has been placed.  [facility] [place], [place] [address] Phone: [phone]  Try previous Diclofenac cream on the R finger joint, use up to 3 times a day as needed for pain and swelling, if helping can try re-order and new rx, just let me know.  In future can check an x-ray if want, but most likely this is arthritis. Try to limit over use of the finger joint especially with gripping.  Fasting blood work today.  Will also add the Uric acid for possible gout. Limit processed foods and gluten because those can cause increased inflammation in body.  DUE for FASTING BLOOD WORK (no food or drink after midnight before the lab appointment, only water or coffee without cream/sugar on the morning of)  SCHEDULE "Lab Only" visit in the morning at the clinic for lab draw in 6 MONTHS   - Make sure Lab Only appointment is at about 1 week before your next appointment, so that results will be available  For Lab Results, once available within 2-3 days of blood draw, you can can log in to MyChart online to view your results and a brief explanation. Also, we can discuss results at next follow-up visit.   Please schedule a Follow-up Appointment to: Return in about 6 months (around 06/29/2020) for Annual Physical.  If you have any other questions or concerns, please feel free to call the office or send a message through [facility]. You may also schedule an earlier appointment if necessary.  Additionally, you may be receiving a survey about your experience at our office within a few days to 1 week by e-mail or mail. We value your feedback.  Nobie Putnam, DO [place]

## 2020-01-01 LAB — HEMOGLOBIN A1C
Hgb A1c MFr Bld: 5.8 % of total Hgb — ABNORMAL HIGH (ref <5.7)
Mean Plasma Glucose: 120 (calc)
eAG (mmol/L): 6.6 (calc)

## 2020-01-01 LAB — LIPID PANEL
Cholesterol: 247 mg/dL — ABNORMAL HIGH (ref <200)
HDL: 69 mg/dL (ref >=50)
LDL Cholesterol (Calc): 161 mg/dL (calc) — ABNORMAL HIGH
Non-HDL Cholesterol (Calc): 178 mg/dL (calc) — ABNORMAL HIGH (ref <130)
Total CHOL/HDL Ratio: 3.6 (calc) (ref <5.0)
Triglycerides: 72 mg/dL (ref <150)

## 2020-01-01 LAB — T4, FREE: Free T4: 1 ng/dL (ref 0.8–1.8)

## 2020-01-01 LAB — URIC ACID: Uric Acid, Serum: 5.9 mg/dL (ref 2.5–7.0)

## 2020-01-01 LAB — TSH: TSH: 2.23 mIU/L (ref 0.40–4.50)

## 2020-01-20 ENCOUNTER — Other Ambulatory Visit: Payer: Self-pay | Admitting: Family Medicine

## 2020-01-20 DIAGNOSIS — I10 Essential (primary) hypertension: Secondary | ICD-10-CM

## 2020-01-20 MED ORDER — LOSARTAN POTASSIUM 100 MG PO TABS: ORAL_TABLET | ORAL | 1 refills | Status: DC

## 2020-02-19 ENCOUNTER — Other Ambulatory Visit: Payer: Self-pay | Admitting: Family Medicine

## 2020-02-19 DIAGNOSIS — M79642 Pain in left hand: Secondary | ICD-10-CM

## 2020-02-19 DIAGNOSIS — M79641 Pain in right hand: Secondary | ICD-10-CM

## 2020-02-19 DIAGNOSIS — M8949 Other hypertrophic osteoarthropathy, multiple sites: Secondary | ICD-10-CM

## 2020-02-19 DIAGNOSIS — M159 Polyosteoarthritis, unspecified: Secondary | ICD-10-CM

## 2020-02-19 NOTE — Telephone Encounter (Signed)
Requested Prescriptions  Pending Prescriptions Disp Refills  . meloxicam (MOBIC) 7.5 MG tablet [Pharmacy Med Name: MELOXICAM 7.5MG  TABLETS] 30 tablet 5    Sig: TAKE 1 TABLET(7.5 MG) BY MOUTH DAILY AS NEEDED FOR PAIN     Analgesics:  COX2 Inhibitors Passed - 02/19/2020  7:09 AM      Passed - HGB in normal range and within 360 days    Hemoglobin  Date Value Ref Range Status  06/25/2019 14.1 11.7 - 15.5 g/dL Final         Passed - Cr in normal range and within 360 days    Creat  Date Value Ref Range Status  06/25/2019 0.73 0.50 - 0.99 mg/dL Final    Comment:    For patients >26 years of age, the reference limit for Creatinine is approximately 13% higher for people identified as African-American. .    Creatinine, Ser  Date Value Ref Range Status  11/12/2019 0.70 0.44 - 1.00 mg/dL Final         Passed - Patient is not pregnant      Passed - Valid encounter within last 12 months    Recent Outpatient Visits          1 month ago Mixed hyperlipidemia   [place], DO   7 months ago Annual physical exam   [place], DO   1 year ago Shortness of breath   [place], DO   1 year ago Annual physical exam   [facility] Olin Hauser, DO   2 years ago Community acquired pneumonia of right middle lobe of lung [facility])   [facility] [place], Devonne Doughty, DO

## 2020-02-26 ENCOUNTER — Ambulatory Visit: Payer: BC Managed Care – PPO | Attending: Internal Medicine

## 2020-02-26 DIAGNOSIS — Z23 Encounter for immunization: Secondary | ICD-10-CM

## 2020-02-26 NOTE — Progress Notes (Signed)
   Covid-19 Vaccination Clinic  Name:  [name]    MRN: [id number] DOB: [birth year]  02/26/2020  [name] was observed post Covid-19 immunization for 15 minutes without incident. She was provided with Vaccine Information Sheet and instruction to access the V-Safe system.   [name] was instructed to call 911 with any severe reactions post vaccine: Marland Kitchen Difficulty breathing  . Swelling of face and throat  . A fast heartbeat  . A bad rash all over body  . Dizziness and weakness   Immunizations Administered    Name Date Dose VIS Date Route   Pfizer COVID-19 Vaccine 02/26/2020 11:30 AM 0.3 mL 12/31/2018 Intramuscular   Manufacturer: [employer], [employer]   Lot: [id number]   [place]: [id number]

## 2020-03-23 ENCOUNTER — Ambulatory Visit: Payer: BC Managed Care – PPO | Attending: Internal Medicine

## 2020-03-23 DIAGNOSIS — Z23 Encounter for immunization: Secondary | ICD-10-CM

## 2020-03-23 NOTE — Progress Notes (Signed)
   Covid-19 Vaccination Clinic  Name:  [name]    MRN: [id number] DOB: 1961-01-10  03/23/2020  [name] was observed post Covid-19 immunization for 15 minutes without incident. She was provided with Vaccine Information Sheet and instruction to access the V-Safe system.   [name] was instructed to call 911 with any severe reactions post vaccine: Marland Kitchen Difficulty breathing  . Swelling of face and throat  . A fast heartbeat  . A bad rash all over body  . Dizziness and weakness   Immunizations Administered    Name Date Dose VIS Date Route   Pfizer COVID-19 Vaccine 03/23/2020  8:15 AM 0.3 mL 12/31/2018 Intramuscular   Manufacturer: [place]   Lot: [id number]   [place]: [id number]

## 2020-06-24 ENCOUNTER — Other Ambulatory Visit: Payer: Self-pay

## 2020-06-24 ENCOUNTER — Other Ambulatory Visit: Payer: BC Managed Care – PPO

## 2020-06-24 DIAGNOSIS — Z0000 Encounter for general adult medical examination without abnormal findings: Secondary | ICD-10-CM | POA: Diagnosis not present

## 2020-06-24 DIAGNOSIS — E782 Mixed hyperlipidemia: Secondary | ICD-10-CM

## 2020-06-24 DIAGNOSIS — I10 Essential (primary) hypertension: Secondary | ICD-10-CM

## 2020-06-24 DIAGNOSIS — R7309 Other abnormal glucose: Secondary | ICD-10-CM | POA: Diagnosis not present

## 2020-06-25 LAB — CBC WITH DIFFERENTIAL/PLATELET
Absolute Monocytes: 414 cells/uL (ref 200–950)
Basophils Absolute: 48 cells/uL (ref 0–200)
Basophils Relative: 0.8 %
Eosinophils Absolute: 108 cells/uL (ref 15–500)
Eosinophils Relative: 1.8 %
HCT: 41.2 % (ref 35.0–45.0)
Hemoglobin: 13.6 g/dL (ref 11.7–15.5)
Lymphs Abs: 2280 cells/uL (ref 850–3900)
MCH: 28.9 pg (ref 27.0–33.0)
MCHC: 33 g/dL (ref 32.0–36.0)
MCV: 87.5 fL (ref 80.0–100.0)
MPV: 9.6 fL (ref 7.5–12.5)
Monocytes Relative: 6.9 %
Neutro Abs: 3150 cells/uL (ref 1500–7800)
Neutrophils Relative %: 52.5 %
Platelets: 261 10*3/uL (ref 140–400)
RBC: 4.71 10*6/uL (ref 3.80–5.10)
RDW: 13.2 % (ref 11.0–15.0)
Total Lymphocyte: 38 %
WBC: 6 10*3/uL (ref 3.8–10.8)

## 2020-06-25 LAB — LIPID PANEL
Cholesterol: 232 mg/dL — ABNORMAL HIGH (ref <200)
HDL: 63 mg/dL (ref >=50)
LDL Cholesterol (Calc): 155 mg/dL (calc) — ABNORMAL HIGH
Non-HDL Cholesterol (Calc): 169 mg/dL (calc) — ABNORMAL HIGH (ref <130)
Total CHOL/HDL Ratio: 3.7 (calc) (ref <5.0)
Triglycerides: 51 mg/dL (ref <150)

## 2020-06-25 LAB — COMPLETE METABOLIC PANEL WITH GFR
AG Ratio: 1.7 (calc) (ref 1.0–2.5)
ALT: 17 U/L (ref 6–29)
AST: 22 U/L (ref 10–35)
Albumin: 4.4 g/dL (ref 3.6–5.1)
Alkaline phosphatase (APISO): 52 U/L (ref 37–153)
BUN: 12 mg/dL (ref 7–25)
CO2: 30 mmol/L (ref 20–32)
Calcium: 9.5 mg/dL (ref 8.6–10.4)
Chloride: 103 mmol/L (ref 98–110)
Creat: 0.7 mg/dL (ref 0.50–0.99)
GFR, Est African American: 108 mL/min/{1.73_m2} (ref >=60)
GFR, Est Non African American: 94 mL/min/{1.73_m2} (ref >=60)
Globulin: 2.6 g/dL (calc) (ref 1.9–3.7)
Glucose, Bld: 110 mg/dL — ABNORMAL HIGH (ref 65–99)
Potassium: 4.1 mmol/L (ref 3.5–5.3)
Sodium: 139 mmol/L (ref 135–146)
Total Bilirubin: 0.4 mg/dL (ref 0.2–1.2)
Total Protein: 7 g/dL (ref 6.1–8.1)

## 2020-06-25 LAB — HEMOGLOBIN A1C
Hgb A1c MFr Bld: 5.7 % of total Hgb — ABNORMAL HIGH (ref <5.7)
Mean Plasma Glucose: 117 (calc)
eAG (mmol/L): 6.5 (calc)

## 2020-07-01 ENCOUNTER — Ambulatory Visit (INDEPENDENT_AMBULATORY_CARE_PROVIDER_SITE_OTHER): Payer: BC Managed Care – PPO | Admitting: Family Medicine

## 2020-07-01 ENCOUNTER — Other Ambulatory Visit: Payer: Self-pay | Admitting: Family Medicine

## 2020-07-01 ENCOUNTER — Other Ambulatory Visit: Payer: Self-pay

## 2020-07-01 ENCOUNTER — Encounter: Payer: Self-pay | Admitting: Family Medicine

## 2020-07-01 VITALS — BP 115/51 | HR 59 | Temp 97.5°F | Resp 16 | Ht 63.0 in | Wt 179.6 lb

## 2020-07-01 DIAGNOSIS — Z0000 Encounter for general adult medical examination without abnormal findings: Secondary | ICD-10-CM

## 2020-07-01 DIAGNOSIS — E782 Mixed hyperlipidemia: Secondary | ICD-10-CM | POA: Diagnosis not present

## 2020-07-01 DIAGNOSIS — I10 Essential (primary) hypertension: Secondary | ICD-10-CM | POA: Diagnosis not present

## 2020-07-01 DIAGNOSIS — R7309 Other abnormal glucose: Secondary | ICD-10-CM

## 2020-07-01 DIAGNOSIS — E669 Obesity, unspecified: Secondary | ICD-10-CM

## 2020-07-01 MED ORDER — LOSARTAN POTASSIUM 100 MG PO TABS: ORAL_TABLET | ORAL | 3 refills | Status: DC

## 2020-07-01 MED ORDER — HYDROCHLOROTHIAZIDE 25 MG PO TABS: ORAL_TABLET | ORAL | 3 refills | Status: DC

## 2020-07-01 NOTE — Assessment & Plan Note (Signed)
Well-controlled Pre-DM with A1c 5.7 Concern with obesity, HTN, HLD  Plan:  1. Not on any therapy currently  2. Encourage improved lifestyle - low carb, low sugar diet, reduce portion size, continue improving regular exercise 3. Follow-up yearly A1c

## 2020-07-01 NOTE — Progress Notes (Signed)
Subjective:    Patient ID: [name], female    DOB: [birth year], 59 y.o.   MRN: [id number]  [name] is a 59 y.o. female presenting on 07/01/2020 for Annual Exam (depression pHQ 2 negative )   HPI   Here for Annual Physical and lab review.  CHRONIC HTN: Reportschecks BP occasionally, no new concerns. Current Meds -HCTZ 25mg , Losartan 100mg  Reports good compliance, took meds today. Tolerating well, w/o complaints. Drinks some coffee, decaf Admits some swelling episodic worse left foot, she improves diet to avoid, worse on hot days Denies CP, dyspnea, HA, edema currently, dizziness / lightheadedness  Elevated A1c/ Hyperlipidemia Elevated LDL / BMI >31 Previously A1c 5.5 to 5.8, last lab result 5.7 Previously 06/2020, LDL improved to 155, prior 170, not on statin CBGs:Not checking Meds:Never on Reports good compliance. Tolerating well w/o side-effects Currently on ARB Lifestyle: - Diet (Significant limiting carbs and starches, no fried foods, drinking mostly water - now stopped eating red meat and processed carbs because these have affected her joint pain worse) - Exercise (walkingintermittently with some exercise bike and some walking) Denies hypoglycemia  Osteoarthritis / Hand Joint Pain - Reviewed background history, knownproblem with OA/DJD in both hands, had x-rays bilateral 08/2017 Right Index Finger - Describes swelling and tenderness in distal R index finger. Says onset for few months. Worse with grip and opening jar. - See above, reduced red meat and processed carbs/starches - seems to flare up joint pain   Health Maintenance:  - Due for Flu Vaccine- will return in Fall 2021 with Alvester Chou to get Flu shots.  -Colon CA Screening:Last Colon[facility]1/18/19[place] [place] results werereported negative without polyps, good for5years. Currently asymptomatic. Family history with Father colon CA.  Due mammoram - last done 07/05/17,  negative. She has order in system and will need to call to schedule.  Cervical ca screening last done 06/29/17 - next due 2021 [facility] - she will call and schedule now.  Depression screen [facility] 2/9 07/01/2020 12/31/2019 06/30/2019  Decreased Interest 0 0 0  Down, Depressed, Hopeless 0 0 0  PHQ - 2 Score 0 0 0    Past Medical History:  Diagnosis Date  . Essential hypertension    controlled with medication;   . Family history of colon cancer    father  . Osteoarthritis    Past Surgical History:  Procedure Laterality Date  . BRAIN SURGERY  11/2016   removal of meningioma  . CERVICAL BIOPSY  W/ LOOP ELECTRODE EXCISION  1999  . COLONOSCOPY    . TONSILLECTOMY AND ADENOIDECTOMY     Social History   Socioeconomic History  . Marital status: Married    Spouse name: Not on file  . Number of children: 2  . Years of education: Not on file  . Highest education level: Not on file  Occupational History  . Occupation: Education officer, community  Tobacco Use  . Smoking status: Never Smoker  . Smokeless tobacco: Never Used  Vaping Use  . Vaping Use: Never used  Substance and Sexual Activity  . Alcohol use: No    Alcohol/week: 0.0 standard drinks  . Drug use: No  . Sexual activity: Yes    Birth control/protection: Post-menopausal  Other Topics Concern  . Not on file  Social History Narrative  . Not on file   Social Determinants of Health   Financial Resource Strain:   . Difficulty of Paying Living Expenses: Not on file  Food Insecurity:   .  Worried About Charity fundraiser in the Last Year: Not on file  . Ran Out of Food in the Last Year: Not on file  Transportation Needs:   . Lack of Transportation (Medical): Not on file  . Lack of Transportation (Non-Medical): Not on file  Physical Activity:   . Days of Exercise per Week: Not on file  . Minutes of Exercise per Session: Not on file  Stress:   . Feeling of Stress : Not on file  Social Connections:   . Frequency of Communication  with Friends and Family: Not on file  . Frequency of Social Gatherings with Friends and Family: Not on file  . Attends Religious Services: Not on file  . Active Member of Clubs or Organizations: Not on file  . Attends Archivist Meetings: Not on file  . Marital Status: Not on file  Intimate Partner Violence:   . Fear of Current or Ex-Partner: Not on file  . Emotionally Abused: Not on file  . Physically Abused: Not on file  . Sexually Abused: Not on file   Family History  Problem Relation Age of Onset  . Hypertension Mother   . Stroke Mother        2012  . Hypertension Father   . Cancer - Colon Father   . Diabetes Brother   . Breast cancer Maternal Aunt 28  . Diabetes Maternal Aunt    Current Outpatient Medications on File Prior to Visit  Medication Sig  . acetaminophen (TYLENOL) 325 MG tablet Take 650 mg by mouth every 6 (six) hours as needed.  . diclofenac sodium (VOLTAREN) 1 % GEL Apply 2 g topically 3 (three) times daily.  . fexofenadine (ALLEGRA) 60 MG tablet Take 60 mg by mouth daily.  . Fish Oil-Cholecalciferol (FISH OIL + D3 PO) Take by mouth.  . fluticasone (FLONASE) 50 MCG/ACT nasal spray   . meloxicam (MOBIC) 7.5 MG tablet TAKE 1 TABLET(7.5 MG) BY MOUTH DAILY AS NEEDED FOR PAIN  . Multiple Vitamins-Minerals (HAIR/SKIN/NAILS/BIOTIN PO) Take by mouth.   No current facility-administered medications on file prior to visit.    Review of Systems  Constitutional: Negative for activity change, appetite change, chills, diaphoresis, fatigue and fever.  HENT: Negative for congestion and hearing loss.   Eyes: Negative for visual disturbance.  Respiratory: Negative for cough, chest tightness, shortness of breath and wheezing.   Cardiovascular: Negative for chest pain, palpitations and leg swelling.  Gastrointestinal: Negative for abdominal pain, constipation, diarrhea, nausea and vomiting.  Endocrine: Negative for cold intolerance.  Genitourinary: Negative for  dysuria, frequency and hematuria.  Musculoskeletal: Negative for arthralgias and neck pain.  Skin: Negative for rash.  Allergic/Immunologic: Negative for environmental allergies.  Neurological: Negative for dizziness, weakness, light-headedness, numbness and headaches.  Hematological: Negative for adenopathy.  Psychiatric/Behavioral: Negative for behavioral problems, dysphoric mood and sleep disturbance.   Per HPI unless specifically indicated above     Objective:    BP (!) 115/51   Pulse (!) 59   Temp (!) 97.5 F (36.4 C)   Resp 16   Ht 5\' 3"  (1.6 m)   Wt 179 lb 9.6 oz (81.5 kg)   SpO2 98%   BMI 31.81 kg/m   Wt Readings from Last 3 Encounters:  07/01/20 179 lb 9.6 oz (81.5 kg)  12/31/19 182 lb (82.6 kg)  06/30/19 178 lb (80.7 kg)    Physical Exam Vitals and nursing note reviewed.  Constitutional:      General: She is not  in acute distress.    Appearance: She is well-developed. She is not diaphoretic.     Comments: Well-appearing, comfortable, cooperative  HENT:     Head: Normocephalic and atraumatic.  Eyes:     General:        Right eye: No discharge.        Left eye: No discharge.     Conjunctiva/sclera: Conjunctivae normal.     Pupils: Pupils are equal, round, and reactive to light.  Neck:     Thyroid: No thyromegaly.     Vascular: No carotid bruit.  Cardiovascular:     Rate and Rhythm: Normal rate and regular rhythm.     Heart sounds: Normal heart sounds. No murmur heard.   Pulmonary:     Effort: Pulmonary effort is normal. No respiratory distress.     Breath sounds: Normal breath sounds. No wheezing or rales.  Abdominal:     General: Bowel sounds are normal. There is no distension.     Palpations: Abdomen is soft. There is no mass.     Tenderness: There is no abdominal tenderness.  Musculoskeletal:        General: No tenderness. Normal range of motion.     Cervical back: Normal range of motion and neck supple.     Right lower leg: No edema.     Left  lower leg: No edema.     Comments: Upper / Lower Extremities: - Normal muscle tone, strength bilateral upper extremities 5/5, lower extremities 5/5  Lymphadenopathy:     Cervical: No cervical adenopathy.  Skin:    General: Skin is warm and dry.     Findings: No erythema or rash.  Neurological:     Mental Status: She is alert and oriented to person, place, and time.     Comments: Distal sensation intact to light touch all extremities  Psychiatric:        Behavior: Behavior normal.     Comments: Well groomed, good eye contact, normal speech and thoughts    Results for orders placed or performed in visit on 06/24/20  Lipid panel  Result Value Ref Range   Cholesterol 232 (H) <200 mg/dL   HDL 63 > OR = 50 mg/dL   Triglycerides 51 <150 mg/dL   LDL Cholesterol (Calc) 155 (H) mg/dL (calc)   Total CHOL/HDL Ratio 3.7 <5.0 (calc)   Non-HDL Cholesterol (Calc) 169 (H) <130 mg/dL (calc)  COMPLETE METABOLIC PANEL WITH GFR  Result Value Ref Range   Glucose, Bld 110 (H) 65 - 99 mg/dL   BUN 12 7 - 25 mg/dL   Creat 0.70 0.50 - 0.99 mg/dL   GFR, Est Non African American 94 > OR = 60 mL/min/1.39m2   GFR, Est African American 108 > OR = 60 mL/min/1.69m2   BUN/Creatinine Ratio NOT APPLICABLE 6 - 22 (calc)   Sodium 139 135 - 146 mmol/L   Potassium 4.1 3.5 - 5.3 mmol/L   Chloride 103 98 - 110 mmol/L   CO2 30 20 - 32 mmol/L   Calcium 9.5 8.6 - 10.4 mg/dL   Total Protein 7.0 6.1 - 8.1 g/dL   Albumin 4.4 3.6 - 5.1 g/dL   Globulin 2.6 1.9 - 3.7 g/dL (calc)   AG Ratio 1.7 1.0 - 2.5 (calc)   Total Bilirubin 0.4 0.2 - 1.2 mg/dL   Alkaline phosphatase (APISO) 52 37 - 153 U/L   AST 22 10 - 35 U/L   ALT 17 6 - 29 U/L  CBC with  Differential/Platelet  Result Value Ref Range   WBC 6.0 3.8 - 10.8 Thousand/uL   RBC 4.71 3.80 - 5.10 Million/uL   Hemoglobin 13.6 11.7 - 15.5 g/dL   HCT 41.2 35 - 45 %   MCV 87.5 80.0 - 100.0 fL   MCH 28.9 27.0 - 33.0 pg   MCHC 33.0 32.0 - 36.0 g/dL   RDW 13.2 11.0 - 15.0  %   Platelets 261 140 - 400 Thousand/uL   MPV 9.6 7.5 - 12.5 fL   Neutro Abs 3,150 1,500 - 7,800 cells/uL   Lymphs Abs 2,280 850 - 3,900 cells/uL   Absolute Monocytes 414 200 - 950 cells/uL   Eosinophils Absolute 108 15 - 500 cells/uL   Basophils Absolute 48 0 - 200 cells/uL   Neutrophils Relative % 52.5 %   Total Lymphocyte 38.0 %   Monocytes Relative 6.9 %   Eosinophils Relative 1.8 %   Basophils Relative 0.8 %  Hemoglobin A1c  Result Value Ref Range   Hgb A1c MFr Bld 5.7 (H) <5.7 % of total Hgb   Mean Plasma Glucose 117 (calc)   eAG (mmol/L) 6.5 (calc)      Assessment & Plan:   Problem List Items Addressed This Visit    Hyperlipidemia    Elevated LDL Last lipid panel 06/2020 The 10-year ASCVD risk score Mikey Bussing DC Jr., et al., 2013) is: 4.1%  Plan: 1. Discuss ASCVD risk - agree to hold statin therapy currently 2. Encourage improved lifestyle - low carb/cholesterol, reduce portion size, continue improving regular exercise      Relevant Medications   hydrochlorothiazide (HYDRODIURIL) 25 MG tablet   losartan (COZAAR) 100 MG tablet   Essential hypertension    Controlled HTN No known complications      Plan:  1. Continue HCTZ 25mg  and Losartan 100mg  daily - refilled 3. Encourage improved lifestyle - low sodium diet, regular exercise 4. Continue monitor BP outside office, bring readings to next visit, if persistently >140/90 or new symptoms notify office sooner      Relevant Medications   hydrochlorothiazide (HYDRODIURIL) 25 MG tablet   losartan (COZAAR) 100 MG tablet   Elevated hemoglobin A1c    Well-controlled Pre-DM with A1c 5.7 Concern with obesity, HTN, HLD  Plan:  1. Not on any therapy currently  2. Encourage improved lifestyle - low carb, low sugar diet, reduce portion size, continue improving regular exercise 3. Follow-up yearly A1c        Other Visit Diagnoses    Annual physical exam    -  Primary   Obesity (BMI 30.0-34.9)          Updated Health  Maintenance information - She will call [facility] to schedule Mammogram, order is in - She will call [facility] for next due pap smear, last 2018 - Due Flu Shot will return. Reviewed recent lab results with patient Encouraged improvement to lifestyle with diet and exercise - Goal of weight loss   Meds ordered this encounter  Medications  . hydrochlorothiazide (HYDRODIURIL) 25 MG tablet    Sig: TAKE 1 TABLET(25 MG) BY MOUTH DAILY    Dispense:  90 tablet    Refill:  3  . losartan (COZAAR) 100 MG tablet    Sig: TAKE 1 TABLET(100 MG) BY MOUTH DAILY    Dispense:  90 tablet    Refill:  3     Follow up plan: Return in about 1 year (around 07/01/2021) for Annual Physical.  Future labs ordered 06/27/21  Sheppard Coil  [facility], [facility] 07/01/2020, 8:38 AM

## 2020-07-01 NOTE — Assessment & Plan Note (Signed)
Controlled HTN No known complications      Plan:  1. Continue HCTZ 25mg and Losartan 100mg daily - refilled 3. Encourage improved lifestyle - low sodium diet, regular exercise 4. Continue monitor BP outside office, bring readings to next visit, if persistently >140/90 or new symptoms notify office sooner 

## 2020-07-01 NOTE — Patient Instructions (Addendum)
Thank you for coming to the office today.  Recent Labs    12/31/19 0851 06/24/20 0827  HGBA1C 5.8* 5.7*   Keep up improving lifestyle to control the blood sugar.  In future we may consider cholesterol statin.  ----------------------------------------------  For Mammogram screening for breast cancer   Call the [facility] below anytime to schedule your own appointment now that order has been placed.  [facility] [place], [place] [address] Phone: [phone]  Due Pap Smear repeat - now 3 years. Last 06/2017  [facility]   Address: [address], [place], [place], [place], [place] [address] Hours: 8AM-5PM Phone: [phone]  --------------------------------------------------   DUE for FASTING BLOOD WORK (no food or drink after midnight before the lab appointment, only water or coffee without cream/sugar on the morning of)  SCHEDULE "Lab Only" visit in the morning at the clinic for lab draw in 1 YEAR  - Make sure Lab Only appointment is at about 1 week before your next appointment, so that results will be available  For Lab Results, once available within 2-3 days of blood draw, you can can log in to MyChart online to view your results and a brief explanation. Also, we can discuss results at next follow-up visit.    Please schedule a Follow-up Appointment to: Return in about 1 year (around 07/01/2021) for Annual Physical.  If you have any other questions or concerns, please feel free to call the office or send a message through [place]. You may also schedule an earlier appointment if necessary.  Additionally, you may be receiving a survey about your experience at our office within a few days to 1 week by e-mail or mail. We value your feedback.  Nobie Putnam, DO [facility]

## 2020-07-01 NOTE — Assessment & Plan Note (Signed)
Elevated LDL Last lipid panel 06/2020 The 10-year ASCVD risk score Mikey Bussing DC Jr., et al., 2013) is: 4.1%  Plan: 1. Discuss ASCVD risk - agree to hold statin therapy currently 2. Encourage improved lifestyle - low carb/cholesterol, reduce portion size, continue improving regular exercise

## 2020-07-02 ENCOUNTER — Other Ambulatory Visit: Payer: Self-pay | Admitting: Family Medicine

## 2020-07-02 DIAGNOSIS — Z1231 Encounter for screening mammogram for malignant neoplasm of breast: Secondary | ICD-10-CM

## 2020-08-17 ENCOUNTER — Other Ambulatory Visit: Payer: Self-pay | Admitting: Family Medicine

## 2020-08-17 DIAGNOSIS — M79641 Pain in right hand: Secondary | ICD-10-CM

## 2020-08-17 DIAGNOSIS — M8949 Other hypertrophic osteoarthropathy, multiple sites: Secondary | ICD-10-CM

## 2020-08-17 DIAGNOSIS — M159 Polyosteoarthritis, unspecified: Secondary | ICD-10-CM

## 2020-08-17 DIAGNOSIS — M79642 Pain in left hand: Secondary | ICD-10-CM

## 2020-08-25 ENCOUNTER — Ambulatory Visit (INDEPENDENT_AMBULATORY_CARE_PROVIDER_SITE_OTHER): Payer: BC Managed Care – PPO

## 2020-08-25 ENCOUNTER — Other Ambulatory Visit: Payer: Self-pay

## 2020-08-25 DIAGNOSIS — Z23 Encounter for immunization: Secondary | ICD-10-CM | POA: Diagnosis not present

## 2020-08-31 ENCOUNTER — Other Ambulatory Visit (HOSPITAL_COMMUNITY): Payer: Self-pay | Admitting: Neurosurgery

## 2020-08-31 ENCOUNTER — Other Ambulatory Visit: Payer: Self-pay | Admitting: Neurosurgery

## 2020-08-31 DIAGNOSIS — D329 Benign neoplasm of meninges, unspecified: Secondary | ICD-10-CM

## 2020-09-16 ENCOUNTER — Other Ambulatory Visit: Payer: Self-pay

## 2020-09-16 ENCOUNTER — Ambulatory Visit
Admission: RE | Admit: 2020-09-16 | Discharge: 2020-09-16 | Disposition: A | Discharge status: Home / Self Care | Payer: BC Managed Care – PPO | Source: Ambulatory Visit | Attending: Neurosurgery | Admitting: Neurosurgery

## 2020-09-16 DIAGNOSIS — D496 Neoplasm of unspecified behavior of brain: Secondary | ICD-10-CM | POA: Diagnosis not present

## 2020-09-16 DIAGNOSIS — G9389 Other specified disorders of brain: Secondary | ICD-10-CM | POA: Diagnosis not present

## 2020-09-16 DIAGNOSIS — R220 Localized swelling, mass and lump, head: Secondary | ICD-10-CM | POA: Diagnosis not present

## 2020-09-16 DIAGNOSIS — D329 Benign neoplasm of meninges, unspecified: Secondary | ICD-10-CM | POA: Diagnosis not present

## 2020-09-16 MED ORDER — GADOBUTROL 1 MMOL/ML IV SOLN
8.0000 mL | Freq: Once | INTRAVENOUS | Status: AC | PRN
  Administered 2020-09-16: 8 mL via INTRAVENOUS

## 2020-09-28 DIAGNOSIS — D329 Benign neoplasm of meninges, unspecified: Secondary | ICD-10-CM | POA: Diagnosis not present

## 2021-02-16 ENCOUNTER — Other Ambulatory Visit: Payer: Self-pay | Admitting: Family Medicine

## 2021-02-16 DIAGNOSIS — M8949 Other hypertrophic osteoarthropathy, multiple sites: Secondary | ICD-10-CM

## 2021-02-16 DIAGNOSIS — M79642 Pain in left hand: Secondary | ICD-10-CM

## 2021-02-16 DIAGNOSIS — M79641 Pain in right hand: Secondary | ICD-10-CM

## 2021-02-16 DIAGNOSIS — M159 Polyosteoarthritis, unspecified: Secondary | ICD-10-CM

## 2021-04-15 ENCOUNTER — Other Ambulatory Visit: Payer: Self-pay | Admitting: Family Medicine

## 2021-04-15 DIAGNOSIS — I10 Essential (primary) hypertension: Secondary | ICD-10-CM

## 2021-06-27 ENCOUNTER — Other Ambulatory Visit: Payer: BC Managed Care – PPO

## 2021-06-29 ENCOUNTER — Encounter: Payer: BC Managed Care – PPO | Admitting: Family Medicine

## 2021-07-04 ENCOUNTER — Encounter: Payer: BC Managed Care – PPO | Admitting: Family Medicine

## 2021-07-12 ENCOUNTER — Other Ambulatory Visit: Payer: Self-pay

## 2021-07-12 DIAGNOSIS — R7309 Other abnormal glucose: Secondary | ICD-10-CM

## 2021-07-12 DIAGNOSIS — Z0000 Encounter for general adult medical examination without abnormal findings: Secondary | ICD-10-CM

## 2021-07-12 DIAGNOSIS — E782 Mixed hyperlipidemia: Secondary | ICD-10-CM

## 2021-07-13 ENCOUNTER — Other Ambulatory Visit: Payer: BC Managed Care – PPO

## 2021-07-13 ENCOUNTER — Other Ambulatory Visit: Payer: Self-pay | Admitting: Family Medicine

## 2021-07-13 ENCOUNTER — Other Ambulatory Visit: Payer: Self-pay

## 2021-07-13 DIAGNOSIS — Z0000 Encounter for general adult medical examination without abnormal findings: Secondary | ICD-10-CM | POA: Diagnosis not present

## 2021-07-13 DIAGNOSIS — R7309 Other abnormal glucose: Secondary | ICD-10-CM

## 2021-07-13 DIAGNOSIS — E782 Mixed hyperlipidemia: Secondary | ICD-10-CM | POA: Diagnosis not present

## 2021-07-14 ENCOUNTER — Other Ambulatory Visit: Payer: Self-pay | Admitting: Family Medicine

## 2021-07-14 DIAGNOSIS — I10 Essential (primary) hypertension: Secondary | ICD-10-CM

## 2021-07-14 LAB — COMPREHENSIVE METABOLIC PANEL
AG Ratio: 1.4 (calc) (ref 1.0–2.5)
ALT: 14 U/L (ref 6–29)
AST: 19 U/L (ref 10–35)
Albumin: 4.3 g/dL (ref 3.6–5.1)
Alkaline phosphatase (APISO): 49 U/L (ref 37–153)
BUN: 12 mg/dL (ref 7–25)
CO2: 27 mmol/L (ref 20–32)
Calcium: 9.8 mg/dL (ref 8.6–10.4)
Chloride: 104 mmol/L (ref 98–110)
Creat: 0.62 mg/dL (ref 0.50–1.05)
Globulin: 3 g/dL (calc) (ref 1.9–3.7)
Glucose, Bld: 106 mg/dL — ABNORMAL HIGH (ref 65–99)
Potassium: 4.1 mmol/L (ref 3.5–5.3)
Sodium: 139 mmol/L (ref 135–146)
Total Bilirubin: 0.5 mg/dL (ref 0.2–1.2)
Total Protein: 7.3 g/dL (ref 6.1–8.1)

## 2021-07-14 LAB — TSH: TSH: 2.33 mIU/L (ref 0.40–4.50)

## 2021-07-14 LAB — CBC WITH DIFFERENTIAL/PLATELET
Absolute Monocytes: 412 cells/uL (ref 200–950)
Basophils Absolute: 39 cells/uL (ref 0–200)
Basophils Relative: 0.8 %
Eosinophils Absolute: 88 cells/uL (ref 15–500)
Eosinophils Relative: 1.8 %
HCT: 42 % (ref 35.0–45.0)
Hemoglobin: 14 g/dL (ref 11.7–15.5)
Lymphs Abs: 2058 cells/uL (ref 850–3900)
MCH: 28.9 pg (ref 27.0–33.0)
MCHC: 33.3 g/dL (ref 32.0–36.0)
MCV: 86.6 fL (ref 80.0–100.0)
MPV: 10.1 fL (ref 7.5–12.5)
Monocytes Relative: 8.4 %
Neutro Abs: 2303 cells/uL (ref 1500–7800)
Neutrophils Relative %: 47 %
Platelets: 271 10*3/uL (ref 140–400)
RBC: 4.85 10*6/uL (ref 3.80–5.10)
RDW: 13 % (ref 11.0–15.0)
Total Lymphocyte: 42 %
WBC: 4.9 10*3/uL (ref 3.8–10.8)

## 2021-07-14 LAB — LIPID PANEL
Cholesterol: 237 mg/dL — ABNORMAL HIGH (ref <200)
HDL: 59 mg/dL (ref >=50)
LDL Cholesterol (Calc): 163 mg/dL (calc) — ABNORMAL HIGH
Non-HDL Cholesterol (Calc): 178 mg/dL (calc) — ABNORMAL HIGH (ref <130)
Total CHOL/HDL Ratio: 4 (calc) (ref <5.0)
Triglycerides: 60 mg/dL (ref <150)

## 2021-07-14 LAB — HEMOGLOBIN A1C
Hgb A1c MFr Bld: 5.7 % of total Hgb — ABNORMAL HIGH (ref <5.7)
Mean Plasma Glucose: 117 mg/dL
eAG (mmol/L): 6.5 mmol/L

## 2021-07-15 NOTE — Telephone Encounter (Signed)
Courtesy refill given on 07/14/21 #30/0 refills, upcoming appointment 07/20/21.

## 2021-07-20 ENCOUNTER — Encounter: Payer: Self-pay | Admitting: Family Medicine

## 2021-07-20 ENCOUNTER — Ambulatory Visit (INDEPENDENT_AMBULATORY_CARE_PROVIDER_SITE_OTHER): Payer: BC Managed Care – PPO | Admitting: Family Medicine

## 2021-07-20 ENCOUNTER — Other Ambulatory Visit: Payer: Self-pay

## 2021-07-20 VITALS — BP 138/68 | HR 64 | Ht 63.0 in | Wt 182.8 lb

## 2021-07-20 DIAGNOSIS — Z0000 Encounter for general adult medical examination without abnormal findings: Secondary | ICD-10-CM

## 2021-07-20 DIAGNOSIS — Z1231 Encounter for screening mammogram for malignant neoplasm of breast: Secondary | ICD-10-CM

## 2021-07-20 DIAGNOSIS — R7309 Other abnormal glucose: Secondary | ICD-10-CM

## 2021-07-20 DIAGNOSIS — E782 Mixed hyperlipidemia: Secondary | ICD-10-CM

## 2021-07-20 DIAGNOSIS — I10 Essential (primary) hypertension: Secondary | ICD-10-CM

## 2021-07-20 NOTE — Progress Notes (Signed)
Subjective:    Patient ID: Meredith DABDOUB, female    DOB: [birth year], 60 y.o.   MRN: [id number]  [name] is a 60 y.o. female presenting on 07/20/2021 for Annual Exam   HPI  Here for Annual Physical and Lab Review.    CHRONIC HTN: Not checking BP recently. Current Meds - HCTZ '25mg'$ , Losartan '100mg'$  Reports good compliance, took meds today. Tolerating well, w/o complaints. Drinks some coffee, decaf Admits some swelling episodic worse left foot, she improves diet to avoid, worse on hot days Denies CP, dyspnea, HA, edema currently, dizziness / lightheadedness   Elevated A1c / Hyperlipidemia Elevated LDL / BMI >32 Previously A1c 5.5 to 5.8, last lab result 5.7, stable in 5 years Previously 07/2021, LDL stable in 160 range, but still elevated CBGs: Not checking Meds: Never on statin therapy. Or other med Reports good compliance. Tolerating well w/o side-effects Currently on ARB Lifestyle: - Diet (Significant limiting carbs and starches, no fried foods, drinking mostly water - now stopped eating red meat and processed carbs because these have affected her joint pain worse) - Exercise (walking intermittently with some exercise bike and some walking) Denies hypoglycemia  Osteoarthritis / Hand Joint Pain - Reviewed background history, known problem with OA/DJD in both hands, had x-rays bilateral 08/2017 Right Index Finger - Describes swelling and tenderness in distal R index finger. Says onset for few months. Worse with grip and opening jar. - See above, reduced red meat and processed carbs/starches - seems to flare up joint pain     Health Maintenance:   - Due for Flu Vaccine- will return in Fall Oct 2022 with Alvester Chou to get Flu shots.   - Colon CA Screening: Last Colonoscopy 11/23/17 (done by Dr Tami Lin GI at [facility]), results were reported negative without polyps, good for 5 years. Currently asymptomatic. Family history with Father colon CA.   Due mammoram - she cancelled apt  previous schedule will need re schedule   Cervical ca screening last done 06/29/17 - next due anytime, [place] she will re-schedule.   Depression screen [facility] 2/9 07/20/2021 07/01/2020 12/31/2019  Decreased Interest 0 0 0  Down, Depressed, Hopeless 0 0 0  PHQ - 2 Score 0 0 0  Altered sleeping 1 - -  Tired, decreased energy 2 - -  Change in appetite 0 - -  Feeling bad or failure about yourself  0 - -  Trouble concentrating 0 - -  Moving slowly or fidgety/restless 0 - -  Suicidal thoughts 0 - -  PHQ-9 Score 3 - -  Difficult doing work/chores Not difficult at all - -    Past Medical History:  Diagnosis Date   Essential hypertension    controlled with medication;    Family history of colon cancer    father   Osteoarthritis    Past Surgical History:  Procedure Laterality Date   BRAIN SURGERY  11/2016   removal of meningioma   CERVICAL BIOPSY  W/ LOOP ELECTRODE EXCISION  1999   COLONOSCOPY     TONSILLECTOMY AND ADENOIDECTOMY     Social History   Socioeconomic History   Marital status: Married    Spouse name: Not on file   Number of children: 2   Years of education: Not on file   Highest education level: Not on file  Occupational History   Occupation: Education officer, community  Tobacco Use   Smoking status: Never   Smokeless tobacco: Never  Vaping Use   Vaping  Use: Never used  Substance and Sexual Activity   Alcohol use: No    Alcohol/week: 0.0 standard drinks   Drug use: No   Sexual activity: Yes    Birth control/protection: Post-menopausal  Other Topics Concern   Not on file  Social History Narrative   Not on file   Social Determinants of Health   Financial Resource Strain: Not on file  Food Insecurity: Not on file  Transportation Needs: Not on file  Physical Activity: Not on file  Stress: Not on file  Social Connections: Not on file  Intimate Partner Violence: Not on file   Family History  Problem Relation Age of Onset   Hypertension Mother     Stroke Mother        2012   Hypertension Father    Cancer - Colon Father    Diabetes Brother    Breast cancer Maternal Aunt 50   Diabetes Maternal Aunt    Current Outpatient Medications on File Prior to Visit  Medication Sig   acetaminophen (TYLENOL) 325 MG tablet Take 650 mg by mouth every 6 (six) hours as needed.   diclofenac sodium (VOLTAREN) 1 % GEL Apply 2 g topically 3 (three) times daily.   fexofenadine (ALLEGRA) 60 MG tablet Take 60 mg by mouth daily.   Fish Oil-Cholecalciferol (FISH OIL + D3 PO) Take by mouth.   fluticasone (FLONASE) 50 MCG/ACT nasal spray    hydrochlorothiazide (HYDRODIURIL) 25 MG tablet TAKE 1 TABLET(25 MG) BY MOUTH DAILY   losartan (COZAAR) 100 MG tablet TAKE 1 TABLET(100 MG) BY MOUTH DAILY   meloxicam (MOBIC) 7.5 MG tablet TAKE 1 TABLET(7.5 MG) BY MOUTH DAILY AS NEEDED FOR PAIN   Multiple Vitamins-Minerals (HAIR/SKIN/NAILS/BIOTIN PO) Take by mouth.   No current facility-administered medications on file prior to visit.    Review of Systems  Constitutional:  Negative for activity change, appetite change, chills, diaphoresis, fatigue and fever.  HENT:  Negative for congestion and hearing loss.   Eyes:  Negative for visual disturbance.  Respiratory:  Negative for cough, chest tightness, shortness of breath and wheezing.   Cardiovascular:  Negative for chest pain, palpitations and leg swelling.  Gastrointestinal:  Negative for abdominal pain, constipation, diarrhea, nausea and vomiting.  Genitourinary:  Negative for dysuria, frequency and hematuria.  Musculoskeletal:  Negative for arthralgias and neck pain.  Skin:  Negative for rash.  Neurological:  Negative for dizziness, weakness, light-headedness, numbness and headaches.  Hematological:  Negative for adenopathy.  Psychiatric/Behavioral:  Negative for behavioral problems, dysphoric mood and sleep disturbance.   Per HPI unless specifically indicated above      Objective:    BP 138/68 (BP Location:  Left Arm, Cuff Size: Normal)   Pulse 64   Ht '5\' 3"'$  (1.6 m)   Wt 182 lb 12.8 oz (82.9 kg)   SpO2 100%   BMI 32.38 kg/m   Wt Readings from Last 3 Encounters:  07/20/21 182 lb 12.8 oz (82.9 kg)  07/01/20 179 lb 9.6 oz (81.5 kg)  12/31/19 182 lb (82.6 kg)    Physical Exam Vitals and nursing note reviewed.  Constitutional:      General: She is not in acute distress.    Appearance: She is well-developed. She is not diaphoretic.     Comments: Well-appearing, comfortable, cooperative  HENT:     Head: Normocephalic and atraumatic.  Eyes:     General:        Right eye: No discharge.  Left eye: No discharge.     Conjunctiva/sclera: Conjunctivae normal.     Pupils: Pupils are equal, round, and reactive to light.  Neck:     Thyroid: No thyromegaly.  Cardiovascular:     Rate and Rhythm: Normal rate and regular rhythm.     Pulses: Normal pulses.     Heart sounds: Normal heart sounds. No murmur heard. Pulmonary:     Effort: Pulmonary effort is normal. No respiratory distress.     Breath sounds: Normal breath sounds. No wheezing or rales.  Abdominal:     General: Bowel sounds are normal. There is no distension.     Palpations: Abdomen is soft. There is no mass.     Tenderness: There is no abdominal tenderness.  Musculoskeletal:        General: No tenderness. Normal range of motion.     Cervical back: Normal range of motion and neck supple.     Right lower leg: No edema.     Left lower leg: No edema.     Comments: Upper / Lower Extremities: - Normal muscle tone, strength bilateral upper extremities 5/5, lower extremities 5/5  Lymphadenopathy:     Cervical: No cervical adenopathy.  Skin:    General: Skin is warm and dry.     Findings: No erythema or rash.  Neurological:     Mental Status: She is alert and oriented to person, place, and time.     Comments: Distal sensation intact to light touch all extremities  Psychiatric:        Mood and Affect: Mood normal.         Behavior: Behavior normal.        Thought Content: Thought content normal.     Comments: Well groomed, good eye contact, normal speech and thoughts     Results for orders placed or performed in visit on 07/13/21  TSH  Result Value Ref Range   TSH 2.33 0.40 - 4.50 mIU/L  Lipid panel  Result Value Ref Range   Cholesterol 237 (H) <200 mg/dL   HDL 59 > OR = 50 mg/dL   Triglycerides 60 <150 mg/dL   LDL Cholesterol (Calc) 163 (H) mg/dL (calc)   Total CHOL/HDL Ratio 4.0 <5.0 (calc)   Non-HDL Cholesterol (Calc) 178 (H) <130 mg/dL (calc)  Comprehensive Metabolic Panel (CMET)  Result Value Ref Range   Glucose, Bld 106 (H) 65 - 99 mg/dL   BUN 12 7 - 25 mg/dL   Creat 0.62 0.50 - 1.05 mg/dL   BUN/Creatinine Ratio NOT APPLICABLE 6 - 22 (calc)   Sodium 139 135 - 146 mmol/L   Potassium 4.1 3.5 - 5.3 mmol/L   Chloride 104 98 - 110 mmol/L   CO2 27 20 - 32 mmol/L   Calcium 9.8 8.6 - 10.4 mg/dL   Total Protein 7.3 6.1 - 8.1 g/dL   Albumin 4.3 3.6 - 5.1 g/dL   Globulin 3.0 1.9 - 3.7 g/dL (calc)   AG Ratio 1.4 1.0 - 2.5 (calc)   Total Bilirubin 0.5 0.2 - 1.2 mg/dL   Alkaline phosphatase (APISO) 49 37 - 153 U/L   AST 19 10 - 35 U/L   ALT 14 6 - 29 U/L  CBC with Differential  Result Value Ref Range   WBC 4.9 3.8 - 10.8 Thousand/uL   RBC 4.85 3.80 - 5.10 Million/uL   Hemoglobin 14.0 11.7 - 15.5 g/dL   HCT 42.0 35.0 - 45.0 %   MCV 86.6 80.0 - 100.0 fL  MCH 28.9 27.0 - 33.0 pg   MCHC 33.3 32.0 - 36.0 g/dL   RDW 13.0 11.0 - 15.0 %   Platelets 271 140 - 400 Thousand/uL   MPV 10.1 7.5 - 12.5 fL   Neutro Abs 2,303 1,500 - 7,800 cells/uL   Lymphs Abs 2,058 850 - 3,900 cells/uL   Absolute Monocytes 412 200 - 950 cells/uL   Eosinophils Absolute 88 15 - 500 cells/uL   Basophils Absolute 39 0 - 200 cells/uL   Neutrophils Relative % 47 %   Total Lymphocyte 42.0 %   Monocytes Relative 8.4 %   Eosinophils Relative 1.8 %   Basophils Relative 0.8 %  HgB A1c  Result Value Ref Range   Hgb A1c MFr  Bld 5.7 (H) <5.7 % of total Hgb   Mean Plasma Glucose 117 mg/dL   eAG (mmol/L) 6.5 mmol/L      Assessment & Plan:   Problem List Items Addressed This Visit     Screening for breast cancer   Relevant Orders   MM DIGITAL SCREENING BILATERAL   Hyperlipidemia   Essential hypertension   Elevated hemoglobin A1c   Other Visit Diagnoses     Annual physical exam    -  Primary       Updated Health Maintenance information She will re-schedule Mammogram and Pap Smear [facility] Reviewed recent lab results with patient Encouraged improvement to lifestyle with diet and exercise Goal of weight loss  Discussion on ASCVD risk score. 7.8% in 10 years, we defer statin today consider Zetia.  A1c stable. At 5.7  HTN controlled mostly, some mild elevated BP today, repeat still mild elevated near borderline. She can keep track of BP outside office and follow up if need.  No orders of the defined types were placed in this encounter.     Follow up plan: Return in about 1 year (around 07/20/2022) for 1 year fasting lab only then 1 week later Annual Physical.  Nobie Putnam, DO [facility] 07/20/2021, 9:16 AM

## 2021-07-20 NOTE — Patient Instructions (Addendum)
Thank you for coming to the office today.  Shingrix (shingles vaccine) 2 doses, 2-6 months apart, very effective but strong shot. And check cost/coverage.  Look into Zetia (ezetimibe) '10mg'$  daily, non statin cholesterol med, let me know if you want to proceed.  Return for Flu Shot when ready.  BP improved. Keep track of it for now, goal < 140 / 90  Let me know if refills needed.  DUE for FASTING BLOOD WORK (no food or drink after midnight before the lab appointment, only water or coffee without cream/sugar on the morning of)  SCHEDULE "Lab Only" visit in the morning at the clinic for lab draw in 1 YEAR  - Make sure Lab Only appointment is at about 1 week before your next appointment, so that results will be available  For Lab Results, once available within 2-3 days of blood draw, you can can log in to MyChart online to view your results and a brief explanation. Also, we can discuss results at next follow-up visit.   Please schedule a Follow-up Appointment to: Return in about 1 year (around 07/20/2022) for 1 year fasting lab only then 1 week later Annual Physical.  If you have any other questions or concerns, please feel free to call the office or send a message through [place]. You may also schedule an earlier appointment if necessary.  Additionally, you may be receiving a survey about your experience at our office within a few days to 1 week by e-mail or mail. We value your feedback.  Nobie Putnam, DO [facility]

## 2021-08-13 ENCOUNTER — Other Ambulatory Visit: Payer: Self-pay | Admitting: Family Medicine

## 2021-08-13 DIAGNOSIS — M159 Polyosteoarthritis, unspecified: Secondary | ICD-10-CM

## 2021-08-13 DIAGNOSIS — M79641 Pain in right hand: Secondary | ICD-10-CM

## 2021-08-13 DIAGNOSIS — M79642 Pain in left hand: Secondary | ICD-10-CM

## 2021-08-13 DIAGNOSIS — I10 Essential (primary) hypertension: Secondary | ICD-10-CM

## 2021-08-13 NOTE — Telephone Encounter (Signed)
Requested Prescriptions  Pending Prescriptions Disp Refills  . meloxicam (MOBIC) 7.5 MG tablet [Pharmacy Med Name: MELOXICAM 7.5MG  TABLETS] 30 tablet 5    Sig: TAKE 1 TABLET(7.5 MG) BY MOUTH DAILY AS NEEDED FOR PAIN     Analgesics:  COX2 Inhibitors Passed - 08/13/2021  7:08 AM      Passed - HGB in normal range and within 360 days    Hemoglobin  Date Value Ref Range Status  07/13/2021 14.0 11.7 - 15.5 g/dL Final         Passed - Cr in normal range and within 360 days    Creat  Date Value Ref Range Status  07/13/2021 0.62 0.50 - 1.05 mg/dL Final         Passed - Patient is not pregnant      Passed - Valid encounter within last 12 months    Recent Outpatient Visits          3 weeks ago Annual physical exam   [place], DO   1 year ago Annual physical exam   [facility] Olin Hauser, DO   1 year ago Mixed hyperlipidemia   [place], Devonne Doughty, DO   2 years ago Annual physical exam   [facility] [place], Devonne Doughty, DO   2 years ago Shortness of breath   [place], DO      Future Appointments            In 11 months [place], Devonne Doughty, DO [facility]           . losartan (COZAAR) 100 MG tablet [Pharmacy Med Name: LOSARTAN 100MG  TABLETS] 90 tablet 0    Sig: TAKE 1 TABLET(100 MG) BY MOUTH DAILY     Cardiovascular:  Angiotensin Receptor Blockers Passed - 08/13/2021  7:08 AM      Passed - Cr in normal range and within 180 days    Creat  Date Value Ref Range Status  07/13/2021 0.62 0.50 - 1.05 mg/dL Final         Passed - K in normal range and within 180 days    Potassium  Date Value Ref Range Status  07/13/2021 4.1 3.5 - 5.3 mmol/L Final         Passed - Patient is not pregnant      Passed - Last BP in normal range    BP Readings from Last 1 Encounters:  07/20/21 138/68          Passed - Valid encounter within last 6 months    Recent Outpatient Visits          3 weeks ago Annual physical exam   [facility] Olin Hauser, DO   1 year ago Annual physical exam   [facility] Olin Hauser, DO   1 year ago Mixed hyperlipidemia   [place], Devonne Doughty, DO   2 years ago Annual physical exam   [facility] Olin Hauser, DO   2 years ago Shortness of breath   [place], DO      Future Appointments            In 11 months [place], [facility], [facility]           . hydrochlorothiazide (HYDRODIURIL)  25 MG tablet [Pharmacy Med Name: HYDROCHLOROTHIAZIDE 25MG  TABLETS] 90 tablet 0    Sig: TAKE 1 TABLET(25 MG) BY MOUTH DAILY     Cardiovascular: Diuretics - Thiazide Passed - 08/13/2021  7:08 AM      Passed - Ca in normal range and within 360 days    Calcium  Date Value Ref Range Status  07/13/2021 9.8 8.6 - 10.4 mg/dL Final         Passed - Cr in normal range and within 360 days    Creat  Date Value Ref Range Status  07/13/2021 0.62 0.50 - 1.05 mg/dL Final         Passed - K in normal range and within 360 days    Potassium  Date Value Ref Range Status  07/13/2021 4.1 3.5 - 5.3 mmol/L Final         Passed - Na in normal range and within 360 days    Sodium  Date Value Ref Range Status  07/13/2021 139 135 - 146 mmol/L Final         Passed - Last BP in normal range    BP Readings from Last 1 Encounters:  07/20/21 138/68         Passed - Valid encounter within last 6 months    Recent Outpatient Visits          3 weeks ago Annual physical exam   [facility] Olin Hauser, DO   1 year ago Annual physical exam   [facility] Olin Hauser, DO   1 year ago Mixed hyperlipidemia   [place], DO   2 years ago Annual physical exam   [facility] Olin Hauser, DO   2 years ago Shortness of breath   [place], DO      Future Appointments            In 11 months [place], Devonne Doughty, [facility], [facility]

## 2021-09-06 ENCOUNTER — Ambulatory Visit (INDEPENDENT_AMBULATORY_CARE_PROVIDER_SITE_OTHER): Payer: BC Managed Care – PPO

## 2021-09-06 ENCOUNTER — Other Ambulatory Visit: Payer: Self-pay

## 2021-09-06 VITALS — Wt 182.0 lb

## 2021-09-06 DIAGNOSIS — Z23 Encounter for immunization: Secondary | ICD-10-CM

## 2021-11-08 ENCOUNTER — Other Ambulatory Visit: Payer: Self-pay | Admitting: Family Medicine

## 2021-11-08 DIAGNOSIS — I10 Essential (primary) hypertension: Secondary | ICD-10-CM

## 2021-11-09 NOTE — Telephone Encounter (Signed)
Per note from OV of 07/20/2021 Pt is to RTC in one year, around 07/20/2022. Requested Prescriptions  Pending Prescriptions Disp Refills   hydrochlorothiazide (HYDRODIURIL) 25 MG tablet [Pharmacy Med Name: HYDROCHLOROTHIAZIDE 25MG  TABLETS] 90 tablet 0    Sig: TAKE 1 TABLET(25 MG) BY MOUTH DAILY     Cardiovascular: Diuretics - Thiazide Passed - 11/08/2021  7:06 AM      Passed - Ca in normal range and within 360 days    Calcium  Date Value Ref Range Status  07/13/2021 9.8 8.6 - 10.4 mg/dL Final         Passed - Cr in normal range and within 360 days    Creat  Date Value Ref Range Status  07/13/2021 0.62 0.50 - 1.05 mg/dL Final         Passed - K in normal range and within 360 days    Potassium  Date Value Ref Range Status  07/13/2021 4.1 3.5 - 5.3 mmol/L Final         Passed - Na in normal range and within 360 days    Sodium  Date Value Ref Range Status  07/13/2021 139 135 - 146 mmol/L Final         Passed - Last BP in normal range    BP Readings from Last 1 Encounters:  07/20/21 138/68         Passed - Valid encounter within last 6 months    Recent Outpatient Visits          3 months ago Annual physical exam   [place], DO   1 year ago Annual physical exam   [facility] Olin Hauser, DO   1 year ago Mixed hyperlipidemia   [place], Devonne Doughty, DO   2 years ago Annual physical exam   [facility] [place], Devonne Doughty, DO   2 years ago Shortness of breath   [place], DO      Future Appointments            In 8 months [place], Devonne Doughty, [facility], [facility]            losartan (COZAAR) 100 MG tablet [Pharmacy Med Name: LOSARTAN 100MG  TABLETS] 90 tablet 0    Sig: TAKE 1 TABLET(100 MG) BY MOUTH DAILY     Cardiovascular:  Angiotensin Receptor Blockers Passed - 11/08/2021  7:06 AM       Passed - Cr in normal range and within 180 days    Creat  Date Value Ref Range Status  07/13/2021 0.62 0.50 - 1.05 mg/dL Final         Passed - K in normal range and within 180 days    Potassium  Date Value Ref Range Status  07/13/2021 4.1 3.5 - 5.3 mmol/L Final         Passed - Patient is not pregnant      Passed - Last BP in normal range    BP Readings from Last 1 Encounters:  07/20/21 138/68         Passed - Valid encounter within last 6 months    Recent Outpatient Visits          3 months ago Annual physical exam   [place], DO   1 year ago Annual physical exam   [facility],  Devonne Doughty, DO   1 year ago Mixed hyperlipidemia   [place], DO   2 years ago Annual physical exam   [facility] [place], Devonne Doughty, DO   2 years ago Shortness of breath   [facility] [place], Devonne Doughty, DO      Future Appointments            In 8 months [place], Devonne Doughty, DO [facility], [facility]

## 2022-02-02 ENCOUNTER — Other Ambulatory Visit: Payer: Self-pay | Admitting: Family Medicine

## 2022-02-02 DIAGNOSIS — M159 Polyosteoarthritis, unspecified: Secondary | ICD-10-CM

## 2022-02-02 DIAGNOSIS — M79641 Pain in right hand: Secondary | ICD-10-CM

## 2022-02-02 NOTE — Telephone Encounter (Signed)
Requested medications are due for refill today.  yes ? ?Requested medications are on the active medications list.  yes ? ?Last refill. 08/13/2021 #30 5 refills ? ?Future visit scheduled.   yes ? ?Notes to clinic.  Refill failed protocol due to missing labs ? ? ? ?Requested Prescriptions  ?Pending Prescriptions Disp Refills  ? meloxicam (MOBIC) 7.5 MG tablet [Pharmacy Med Name: MELOXICAM 7.5MG TABLETS] 30 tablet 5  ?  Sig: TAKE 1 TABLET(7.5 MG) BY MOUTH DAILY AS NEEDED FOR PAIN  ?  ? Analgesics:  COX2 Inhibitors Failed - 02/02/2022  7:04 AM  ?  ?  Failed - Manual Review: Labs are only required if the patient has taken medication for more than 8 weeks.  ?  ?  Failed - eGFR is 30 or above and within 360 days  ?  GFR, Est African American  ?Date Value Ref Range Status  ?06/24/2020 108 > OR = 60 mL/min/1.49m Final  ? ?GFR, Est Non African American  ?Date Value Ref Range Status  ?06/24/2020 94 > OR = 60 mL/min/1.724mFinal  ?  ?  ?  ?  Passed - HGB in normal range and within 360 days  ?  Hemoglobin  ?Date Value Ref Range Status  ?07/13/2021 14.0 11.7 - 15.5 g/dL Final  ?  ?  ?  ?  Passed - Cr in normal range and within 360 days  ?  Creat  ?Date Value Ref Range Status  ?07/13/2021 0.62 0.50 - 1.05 mg/dL Final  ?  ?  ?  ?  Passed - HCT in normal range and within 360 days  ?  HCT  ?Date Value Ref Range Status  ?07/13/2021 42.0 35.0 - 45.0 % Final  ?  ?  ?  ?  Passed - AST in normal range and within 360 days  ?  AST  ?Date Value Ref Range Status  ?07/13/2021 19 10 - 35 U/L Final  ?  ?  ?  ?  Passed - ALT in normal range and within 360 days  ?  ALT  ?Date Value Ref Range Status  ?07/13/2021 14 6 - 29 U/L Final  ?  ?  ?  ?  Passed - Patient is not pregnant  ?  ?  Passed - Valid encounter within last 12 months  ?  Recent Outpatient Visits   ? ?      ? 6 months ago Annual physical exam  ? [place]  ? 1 year ago Annual physical exam  ? So[place]af[place] CapoDO  ? 2 years ago Mixed hyperlipidemia  ? [place]  ? 2 years ago Annual physical exam  ? [place]  ? 3 years ago Shortness of breath  ? [place]  ? ?  ?  ?Future Appointments   ? ?        ? In 5 months [place] [facility]? ?  ? ?  ?  ?  ?  ?

## 2022-02-15 ENCOUNTER — Encounter: Payer: Self-pay | Admitting: Certified Nurse Midwife

## 2022-02-15 ENCOUNTER — Ambulatory Visit (INDEPENDENT_AMBULATORY_CARE_PROVIDER_SITE_OTHER): Payer: BC Managed Care – PPO | Admitting: Certified Nurse Midwife

## 2022-02-15 VITALS — BP 169/79 | HR 80 | Ht 62.0 in | Wt 182.5 lb

## 2022-02-15 DIAGNOSIS — Z01419 Encounter for gynecological examination (general) (routine) without abnormal findings: Secondary | ICD-10-CM | POA: Diagnosis not present

## 2022-02-15 NOTE — Progress Notes (Signed)
? ? ?GYNECOLOGY ANNUAL PREVENTATIVE CARE ENCOUNTER NOTE ? ?History:    ? [name] is a 61 y.o. [phone] female here for a routine annual gynecologic exam.  Current complaints: none.   Denies abnormal vaginal bleeding, discharge, pelvic pain, problems with intercourse or other gynecologic concerns.  ?  ? 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 ?Gynecologic History ?No LMP recorded. Patient is postmenopausal. ?Contraception: post menopausal status ?Last Pap: 2018. Results were: normal  ?Last mammogram: 2018. Results were: normal  ? ?Obstetric History ?OB History  ?Gravida Para Term Preterm AB Living  ?'2 2 2     2  '$ ?SAB IAB Ectopic Multiple Live Births  ?        2  ?  ?# Outcome Date GA Lbr Len/2nd Weight Sex Delivery Anes PTL Lv  ?2 Term 12/10/86   7 lb 2 oz (3.232 kg) F Vag-Spont   LIV  ?1 Term 12/03/82   7 lb 8 oz (3.402 kg) F Vag-Spont   LIV  ? ? ?Past Medical History:  ?Diagnosis Date  ? Essential hypertension   ? controlled with medication;   ? Family history of colon cancer   ? father  ? Osteoarthritis   ? ? ?Past Surgical History:  ?Procedure Laterality Date  ? BRAIN SURGERY  11/2016  ? removal of meningioma  ? CERVICAL BIOPSY  W/ LOOP ELECTRODE EXCISION  1999  ? COLONOSCOPY    ? TONSILLECTOMY AND ADENOIDECTOMY    ? ? ?Current Outpatient Medications on File Prior to Visit  ?Medication Sig Dispense Refill  ? acetaminophen (TYLENOL) 325 MG tablet Take 650 mg by mouth every 6 (six) hours as needed.    ? diclofenac sodium (VOLTAREN) 1 % GEL Apply 2 g topically 3 (three) times daily. 100 g 5  ? fexofenadine (ALLEGRA) 60 MG tablet Take 60 mg by mouth daily.    ? Fish Oil-Cholecalciferol (FISH OIL + D3 PO) Take by mouth.    ? fluticasone (FLONASE) 50 MCG/ACT nasal spray   7  ? hydrochlorothiazide (HYDRODIURIL) 25 MG tablet TAKE 1 TABLET(25 MG) BY MOUTH DAILY 90 tablet 2  ? losartan (COZAAR) 100  MG tablet TAKE 1 TABLET(100 MG) BY MOUTH DAILY 90 tablet 2  ? meloxicam (MOBIC) 7.5 MG tablet TAKE 1 TABLET(7.5 MG) BY MOUTH DAILY AS NEEDED FOR PAIN 30 tablet 5  ? Multiple Vitamins-Minerals (HAIR/SKIN/NAILS/BIOTIN PO) Take by mouth.    ? ?No current facility-administered medications on file prior to visit.  ? ? ?No Known Allergies ? ?Social History:  reports that she has never smoked. She has never used smokeless tobacco. She reports that she does not drink alcohol and does not use drugs. ? ?Family History  ?Problem Relation Age of Onset  ? Hypertension Mother   ? Stroke Mother   ?     2012  ? Hypertension Father   ? Cancer - Colon Father   ? Diabetes Brother   ? Breast cancer Maternal Aunt 52  ? Diabetes Maternal Aunt   ? ? ?The following portions of the patient's history were reviewed and updated as appropriate: allergies, current medications, past family history, past medical history, past social history, past surgical history and problem list. ? ?Review of Systems ?Pertinent items noted in HPI and remainder of comprehensive ROS otherwise negative. ? ?Physical Exam:  ?BP (!) 169/79   Pulse 80   Ht  $'5\' 2"'I$  (1.575 m)   Wt 182 lb 8 oz (82.8 kg)   BMI 33.38 kg/m?  ?CONSTITUTIONAL: Well-developed, well-nourished female in no acute distress.  ?HENT:  Normocephalic, atraumatic, External right and left ear normal. Oropharynx is clear and moist ?EYES: Conjunctivae and EOM are normal. Pupils are equal, round, and reactive to light. No scleral icterus.  ?NECK: Normal range of motion, supple, no masses.  Normal thyroid.  ?SKIN: Skin is warm and dry. No rash noted. Not diaphoretic. No erythema. No pallor. ?MUSCULOSKELETAL: Normal range of motion. No tenderness.  No cyanosis, clubbing, or edema.  2+ distal pulses. ?NEUROLOGIC: Alert and oriented to person, place, and time. Normal reflexes, muscle tone coordination.  ?PSYCHIATRIC: Normal mood and affect. Normal behavior. Normal judgment and thought content. ?CARDIOVASCULAR:  Normal heart rate noted, regular rhythm ?RESPIRATORY: Clear to auscultation bilaterally. Effort and breath sounds normal, no problems with respiration noted. ?BREASTS: Symmetric in size. No masses, tenderness, skin changes, nipple drainage, or lymphadenopathy bilaterally.  ?ABDOMEN: Soft, no distention noted.  No tenderness, rebound or guarding.  ?PELVIC: Normal appearing external genitalia and urethral meatus; normal appearing vaginal mucosa and cervix.  No abnormal discharge noted.  Pap smear obtained.  Normal uterine size, no other palpable masses, no uterine or adnexal tenderness.  . ?  ?Assessment and Plan:  ?  1. Well woman exam with routine gynecological exam ? ? ?Pap:  ?Will follow up results of pap smear and manage accordingly. ?Mammogram : scheduled next month  ?Labs: none due ?Colonoscopy 2019 ?Refills:none ?Referral: none  ?Routine preventative health maintenance measures emphasized. ?Please refer to After Visit Summary for other counseling recommendations.  ?   ? ?Philip Aspen, CNM ?[facility] ?[facility]   ?

## 2022-02-17 ENCOUNTER — Other Ambulatory Visit (HOSPITAL_COMMUNITY)
Admission: RE | Admit: 2022-02-17 | Discharge: 2022-02-17 | Disposition: A | Discharge status: Home / Self Care | Payer: BC Managed Care – PPO | Source: Ambulatory Visit | Attending: Certified Nurse Midwife | Admitting: Certified Nurse Midwife

## 2022-02-17 DIAGNOSIS — Z01419 Encounter for gynecological examination (general) (routine) without abnormal findings: Secondary | ICD-10-CM | POA: Insufficient documentation

## 2022-02-17 NOTE — Addendum Note (Signed)
Addended by: Ova Freshwater on: 02/17/2022 03:35 PM ? ? Modules accepted: Orders ? ?

## 2022-02-21 ENCOUNTER — Ambulatory Visit
Admission: RE | Admit: 2022-02-21 | Discharge: 2022-02-21 | Disposition: A | Discharge status: Home / Self Care | Payer: BC Managed Care – PPO | Source: Ambulatory Visit | Attending: Family Medicine | Admitting: Family Medicine

## 2022-02-21 ENCOUNTER — Other Ambulatory Visit: Payer: Self-pay | Admitting: Physician Assistant

## 2022-02-21 ENCOUNTER — Other Ambulatory Visit: Payer: Self-pay | Admitting: Family Medicine

## 2022-02-21 DIAGNOSIS — N63 Unspecified lump in unspecified breast: Secondary | ICD-10-CM

## 2022-02-21 DIAGNOSIS — Z1231 Encounter for screening mammogram for malignant neoplasm of breast: Secondary | ICD-10-CM | POA: Diagnosis not present

## 2022-02-21 DIAGNOSIS — R928 Other abnormal and inconclusive findings on diagnostic imaging of breast: Secondary | ICD-10-CM

## 2022-02-21 DIAGNOSIS — R921 Mammographic calcification found on diagnostic imaging of breast: Secondary | ICD-10-CM

## 2022-02-22 LAB — CYTOLOGY - PAP
Comment: NEGATIVE
Diagnosis: NEGATIVE
High risk HPV: NEGATIVE

## 2022-03-09 ENCOUNTER — Ambulatory Visit
Admission: RE | Admit: 2022-03-09 | Discharge: 2022-03-09 | Disposition: A | Discharge status: Home / Self Care | Payer: BC Managed Care – PPO | Source: Ambulatory Visit | Attending: Family Medicine | Admitting: Family Medicine

## 2022-03-09 DIAGNOSIS — N63 Unspecified lump in unspecified breast: Secondary | ICD-10-CM | POA: Insufficient documentation

## 2022-03-09 DIAGNOSIS — R921 Mammographic calcification found on diagnostic imaging of breast: Secondary | ICD-10-CM | POA: Insufficient documentation

## 2022-03-09 DIAGNOSIS — R928 Other abnormal and inconclusive findings on diagnostic imaging of breast: Secondary | ICD-10-CM

## 2022-07-19 ENCOUNTER — Other Ambulatory Visit: Payer: BC Managed Care – PPO

## 2022-07-19 ENCOUNTER — Other Ambulatory Visit: Payer: Self-pay

## 2022-07-19 DIAGNOSIS — I10 Essential (primary) hypertension: Secondary | ICD-10-CM | POA: Diagnosis not present

## 2022-07-19 DIAGNOSIS — Z0000 Encounter for general adult medical examination without abnormal findings: Secondary | ICD-10-CM | POA: Diagnosis not present

## 2022-07-19 DIAGNOSIS — E782 Mixed hyperlipidemia: Secondary | ICD-10-CM | POA: Diagnosis not present

## 2022-07-19 DIAGNOSIS — R7309 Other abnormal glucose: Secondary | ICD-10-CM

## 2022-07-20 LAB — CBC WITH DIFFERENTIAL/PLATELET
Absolute Monocytes: 492 cells/uL (ref 200–950)
Basophils Absolute: 42 cells/uL (ref 0–200)
Basophils Relative: 0.7 %
Eosinophils Absolute: 108 cells/uL (ref 15–500)
Eosinophils Relative: 1.8 %
HCT: 40.5 % (ref 35.0–45.0)
Hemoglobin: 14 g/dL (ref 11.7–15.5)
Lymphs Abs: 1968 cells/uL (ref 850–3900)
MCH: 29.8 pg (ref 27.0–33.0)
MCHC: 34.6 g/dL (ref 32.0–36.0)
MCV: 86.2 fL (ref 80.0–100.0)
MPV: 10.4 fL (ref 7.5–12.5)
Monocytes Relative: 8.2 %
Neutro Abs: 3390 cells/uL (ref 1500–7800)
Neutrophils Relative %: 56.5 %
Platelets: 262 10*3/uL (ref 140–400)
RBC: 4.7 10*6/uL (ref 3.80–5.10)
RDW: 13.1 % (ref 11.0–15.0)
Total Lymphocyte: 32.8 %
WBC: 6 10*3/uL (ref 3.8–10.8)

## 2022-07-20 LAB — COMPREHENSIVE METABOLIC PANEL
AG Ratio: 1.4 (calc) (ref 1.0–2.5)
ALT: 14 U/L (ref 6–29)
AST: 17 U/L (ref 10–35)
Albumin: 4.2 g/dL (ref 3.6–5.1)
Alkaline phosphatase (APISO): 51 U/L (ref 37–153)
BUN: 11 mg/dL (ref 7–25)
CO2: 28 mmol/L (ref 20–32)
Calcium: 9.4 mg/dL (ref 8.6–10.4)
Chloride: 103 mmol/L (ref 98–110)
Creat: 0.63 mg/dL (ref 0.50–1.05)
Globulin: 3.1 g/dL (calc) (ref 1.9–3.7)
Glucose, Bld: 105 mg/dL — ABNORMAL HIGH (ref 65–99)
Potassium: 4 mmol/L (ref 3.5–5.3)
Sodium: 139 mmol/L (ref 135–146)
Total Bilirubin: 0.4 mg/dL (ref 0.2–1.2)
Total Protein: 7.3 g/dL (ref 6.1–8.1)

## 2022-07-20 LAB — HEMOGLOBIN A1C
Hgb A1c MFr Bld: 5.5 % of total Hgb (ref <5.7)
Mean Plasma Glucose: 111 mg/dL
eAG (mmol/L): 6.2 mmol/L

## 2022-07-20 LAB — LIPID PANEL
Cholesterol: 226 mg/dL — ABNORMAL HIGH (ref <200)
HDL: 64 mg/dL (ref >=50)
LDL Cholesterol (Calc): 146 mg/dL (calc) — ABNORMAL HIGH
Non-HDL Cholesterol (Calc): 162 mg/dL (calc) — ABNORMAL HIGH (ref <130)
Total CHOL/HDL Ratio: 3.5 (calc) (ref <5.0)
Triglycerides: 68 mg/dL (ref <150)

## 2022-07-20 LAB — TSH: TSH: 2.71 mIU/L (ref 0.40–4.50)

## 2022-07-25 DIAGNOSIS — D485 Neoplasm of uncertain behavior of skin: Secondary | ICD-10-CM | POA: Diagnosis not present

## 2022-07-25 DIAGNOSIS — L578 Other skin changes due to chronic exposure to nonionizing radiation: Secondary | ICD-10-CM | POA: Diagnosis not present

## 2022-07-25 DIAGNOSIS — D225 Melanocytic nevi of trunk: Secondary | ICD-10-CM | POA: Diagnosis not present

## 2022-07-25 DIAGNOSIS — L821 Other seborrheic keratosis: Secondary | ICD-10-CM | POA: Diagnosis not present

## 2022-07-25 DIAGNOSIS — D235 Other benign neoplasm of skin of trunk: Secondary | ICD-10-CM | POA: Diagnosis not present

## 2022-07-26 ENCOUNTER — Ambulatory Visit (INDEPENDENT_AMBULATORY_CARE_PROVIDER_SITE_OTHER): Payer: BC Managed Care – PPO | Admitting: Family Medicine

## 2022-07-26 ENCOUNTER — Encounter: Payer: Self-pay | Admitting: Family Medicine

## 2022-07-26 VITALS — BP 130/60 | HR 65 | Ht 62.0 in | Wt 180.6 lb

## 2022-07-26 DIAGNOSIS — R7309 Other abnormal glucose: Secondary | ICD-10-CM | POA: Diagnosis not present

## 2022-07-26 DIAGNOSIS — I10 Essential (primary) hypertension: Secondary | ICD-10-CM | POA: Diagnosis not present

## 2022-07-26 DIAGNOSIS — Z0000 Encounter for general adult medical examination without abnormal findings: Secondary | ICD-10-CM | POA: Diagnosis not present

## 2022-07-26 DIAGNOSIS — E782 Mixed hyperlipidemia: Secondary | ICD-10-CM

## 2022-07-26 DIAGNOSIS — Z23 Encounter for immunization: Secondary | ICD-10-CM | POA: Diagnosis not present

## 2022-07-26 MED ORDER — LOSARTAN POTASSIUM 100 MG PO TABS: 100.0000 mg | ORAL_TABLET | Freq: Every day | ORAL | 3 refills | Status: DC

## 2022-07-26 MED ORDER — HYDROCHLOROTHIAZIDE 25 MG PO TABS: 25.0000 mg | ORAL_TABLET | Freq: Every day | ORAL | 3 refills | Status: DC

## 2022-07-26 NOTE — Patient Instructions (Addendum)
Thank you for coming to the office today.  Refills sent for 1 year both BP meds  Flu Shot today  Reconsider Shingles vaccine, or wait until 65+ medicare  Best lab results in past 4 years, with regards to cholesterol LDL, A1c sugar, and overall results.  DUE for FASTING BLOOD WORK (no food or drink after midnight before the lab appointment, only water or coffee without cream/sugar on the morning of)  SCHEDULE "Lab Only" visit in the morning at the clinic for lab draw in 1 YEAR  - Make sure Lab Only appointment is at about 1 week before your next appointment, so that results will be available  For Lab Results, once available within 2-3 days of blood draw, you can can log in to MyChart online to view your results and a brief explanation. Also, we can discuss results at next follow-up visit.   Please schedule a Follow-up Appointment to: Return in about 1 year (around 07/27/2023) for 1 year fasting lab only then 1 week later Annual Physical.  If you have any other questions or concerns, please feel free to call the office or send a message through [place]. You may also schedule an earlier appointment if necessary.  Additionally, you may be receiving a survey about your experience at our office within a few days to 1 week by e-mail or mail. We value your feedback.  Nobie Putnam, DO [place]

## 2022-07-26 NOTE — Progress Notes (Unsigned)
Subjective:    Patient ID: [name], female    DOB: Oct 24, 1961, 61 y.o.   MRN: [id number]  [name] is a 61 y.o. female presenting on 07/26/2022 for Annual Exam   HPI  Here for Annual Physical and Lab Review.    CHRONIC HTN: Not checking BP recently. Current Meds - HCTZ '25mg'$ , Losartan '100mg'$  Reports good compliance, took meds today. Tolerating well, w/o complaints. Drinks some coffee, decaf Admits some swelling episodic worse left foot, she improves diet to avoid, worse on hot days Denies CP, dyspnea, HA, edema currently, dizziness / lightheadedness   Elevated A1c / Hyperlipidemia Elevated LDL / BMI >33 Previously A1c 5.5 to 5.8, last lab result 5.5, significantly improved on lifestyle Previously 07/2022, LDL down to 146 previous 160-170 CBGs: Not checking Meds: Never on statin therapy. Or other med Reports good compliance. Tolerating well w/o side-effects Currently on ARB Lifestyle: Improved, feels good, less indigestion - Diet (Significant healthier options, improved meal prep, now eating better small meals with snack to avoid excess late night eating) - Exercise (walking intermittently with some exercise bike and some walking) Denies hypoglycemia   Osteoarthritis / Hand Joint Pain - Reviewed background history, known problem with OA/DJD in both hands, had x-rays bilateral 08/2017 Right Index Finger - Describes swelling and tenderness in distal R index finger. Says onset for few months. Worse with grip and opening jar. - See above, reduced red meat and processed carbs/starches - seems to flare up joint pain   ***Foot pain with inserts, has some numbness.   Health Maintenance:   Due for Flu Vaccine today  Due for Shingles vaccine, still considering this.   - Colon CA Screening: Last Colonoscopy 11/23/17 (done by Dr Tami Lin [facility] at [facility]), results were reported negative without polyps, good for 5 years. Currently asymptomatic. Family history with Father colon  CA. Next 2024   Mammogram abnormal 02/2022, repeat US diagnostic, normal, repeat 1 yr, 02/2023   Cervical ca screening last done 02/17/22     07/20/2021    9:11 AM 07/01/2020    8:52 AM 12/31/2019    8:04 AM  Depression screen PHQ 2/9  Decreased Interest 0 0 0  Down, Depressed, Hopeless 0 0 0  PHQ - 2 Score 0 0 0  Altered sleeping 1    Tired, decreased energy 2    Change in appetite 0    Feeling bad or failure about yourself  0    Trouble concentrating 0    Moving slowly or fidgety/restless 0    Suicidal thoughts 0    PHQ-9 Score 3    Difficult doing work/chores Not difficult at all      Past Medical History:  Diagnosis Date   Essential hypertension    controlled with medication;    Family history of colon cancer    father   Osteoarthritis    Past Surgical History:  Procedure Laterality Date   BRAIN SURGERY  11/2016   removal of meningioma   CERVICAL BIOPSY  W/ LOOP ELECTRODE EXCISION  1999   COLONOSCOPY     TONSILLECTOMY AND ADENOIDECTOMY     Social History   Socioeconomic History   Marital status: Married    Spouse name: Not on file   Number of children: 2   Years of education: Not on file   Highest education level: Not on file  Occupational History   Occupation: Education officer, community  Tobacco Use   Smoking status: Never   Smokeless  tobacco: Never  Vaping Use   Vaping Use: Never used  Substance and Sexual Activity   Alcohol use: No    Alcohol/week: 0.0 standard drinks of alcohol   Drug use: No   Sexual activity: Yes    Birth control/protection: Post-menopausal  Other Topics Concern   Not on file  Social History Narrative   Not on file   Social Determinants of Health   Financial Resource Strain: Not on file  Food Insecurity: Not on file  Transportation Needs: Not on file  Physical Activity: Not on file  Stress: Not on file  Social Connections: Not on file  Intimate Partner Violence: Not on file   Family History  Problem Relation Age of Onset    Hypertension Mother    Stroke Mother        2012   Hypertension Father    Cancer - Colon Father    Diabetes Brother    Breast cancer Maternal Aunt 50   Diabetes Maternal Aunt    Current Outpatient Medications on File Prior to Visit  Medication Sig   acetaminophen (TYLENOL) 325 MG tablet Take 650 mg by mouth every 6 (six) hours as needed.   diclofenac sodium (VOLTAREN) 1 % GEL Apply 2 g topically 3 (three) times daily.   fexofenadine (ALLEGRA) 60 MG tablet Take 60 mg by mouth daily.   Fish Oil-Cholecalciferol (FISH OIL + D3 PO) Take by mouth.   fluticasone (FLONASE) 50 MCG/ACT nasal spray    meloxicam (MOBIC) 7.5 MG tablet TAKE 1 TABLET(7.5 MG) BY MOUTH DAILY AS NEEDED FOR PAIN   Multiple Vitamins-Minerals (HAIR/SKIN/NAILS/BIOTIN PO) Take by mouth.   No current facility-administered medications on file prior to visit.    Review of Systems  Constitutional:  Negative for activity change, appetite change, chills, diaphoresis, fatigue and fever.  HENT:  Negative for congestion and hearing loss.   Eyes:  Negative for visual disturbance.  Respiratory:  Negative for cough, chest tightness, shortness of breath and wheezing.   Cardiovascular:  Negative for chest pain, palpitations and leg swelling.  Gastrointestinal:  Negative for abdominal pain, constipation, diarrhea, nausea and vomiting.  Genitourinary:  Negative for dysuria, frequency and hematuria.  Musculoskeletal:  Negative for arthralgias and neck pain.  Skin:  Negative for rash.  Neurological:  Negative for dizziness, weakness, light-headedness, numbness and headaches.  Hematological:  Negative for adenopathy.  Psychiatric/Behavioral:  Negative for behavioral problems, dysphoric mood and sleep disturbance.    Per HPI unless specifically indicated above      Objective:    BP 130/60   Pulse 65   Ht '5\' 2"'$  (1.575 m)   Wt 180 lb 9.6 oz (81.9 kg)   SpO2 98%   BMI 33.03 kg/m   Wt Readings from Last 3 Encounters:  07/26/22  180 lb 9.6 oz (81.9 kg)  02/15/22 182 lb 8 oz (82.8 kg)  09/06/21 182 lb (82.6 kg)    Physical Exam Vitals and nursing note reviewed.  Constitutional:      General: She is not in acute distress.    Appearance: She is well-developed. She is not diaphoretic.     Comments: Well-appearing, comfortable, cooperative  HENT:     Head: Normocephalic and atraumatic.  Eyes:     General:        Right eye: No discharge.        Left eye: No discharge.     Conjunctiva/sclera: Conjunctivae normal.     Pupils: Pupils are equal, round, and reactive to light.  Neck:     Thyroid: No thyromegaly.     Vascular: No carotid bruit.  Cardiovascular:     Rate and Rhythm: Normal rate and regular rhythm.     Pulses: Normal pulses.     Heart sounds: Normal heart sounds. No murmur heard. Pulmonary:     Effort: Pulmonary effort is normal. No respiratory distress.     Breath sounds: Normal breath sounds. No wheezing or rales.  Abdominal:     General: Bowel sounds are normal. There is no distension.     Palpations: Abdomen is soft. There is no mass.     Tenderness: There is no abdominal tenderness.  Musculoskeletal:        General: No tenderness. Normal range of motion.     Cervical back: Normal range of motion and neck supple.     Right lower leg: No edema.     Left lower leg: No edema.     Comments: Upper / Lower Extremities: - Normal muscle tone, strength bilateral upper extremities 5/5, lower extremities 5/5  Lymphadenopathy:     Cervical: No cervical adenopathy.  Skin:    General: Skin is warm and dry.     Findings: No erythema or rash.  Neurological:     Mental Status: She is alert and oriented to person, place, and time.     Comments: Distal sensation intact to light touch all extremities  Psychiatric:        Mood and Affect: Mood normal.        Behavior: Behavior normal.        Thought Content: Thought content normal.     Comments: Well groomed, good eye contact, normal speech and thoughts       Results for orders placed or performed in visit on 07/19/22  HgB A1c  Result Value Ref Range   Hgb A1c MFr Bld 5.5 <5.7 % of total Hgb   Mean Plasma Glucose 111 mg/dL   eAG (mmol/L) 6.2 mmol/L  CBC with Differential  Result Value Ref Range   WBC 6.0 3.8 - 10.8 Thousand/uL   RBC 4.70 3.80 - 5.10 Million/uL   Hemoglobin 14.0 11.7 - 15.5 g/dL   HCT 40.5 35.0 - 45.0 %   MCV 86.2 80.0 - 100.0 fL   MCH 29.8 27.0 - 33.0 pg   MCHC 34.6 32.0 - 36.0 g/dL   RDW 13.1 11.0 - 15.0 %   Platelets 262 140 - 400 Thousand/uL   MPV 10.4 7.5 - 12.5 fL   Neutro Abs 3,390 1,500 - 7,800 cells/uL   Lymphs Abs 1,968 850 - 3,900 cells/uL   Absolute Monocytes 492 200 - 950 cells/uL   Eosinophils Absolute 108 15 - 500 cells/uL   Basophils Absolute 42 0 - 200 cells/uL   Neutrophils Relative % 56.5 %   Total Lymphocyte 32.8 %   Monocytes Relative 8.2 %   Eosinophils Relative 1.8 %   Basophils Relative 0.7 %  Comprehensive Metabolic Panel (CMET)  Result Value Ref Range   Glucose, Bld 105 (H) 65 - 99 mg/dL   BUN 11 7 - 25 mg/dL   Creat 0.63 0.50 - 1.05 mg/dL   BUN/Creatinine Ratio SEE NOTE: 6 - 22 (calc)   Sodium 139 135 - 146 mmol/L   Potassium 4.0 3.5 - 5.3 mmol/L   Chloride 103 98 - 110 mmol/L   CO2 28 20 - 32 mmol/L   Calcium 9.4 8.6 - 10.4 mg/dL   Total Protein 7.3 6.1 - 8.1 g/dL  Albumin 4.2 3.6 - 5.1 g/dL   Globulin 3.1 1.9 - 3.7 g/dL (calc)   AG Ratio 1.4 1.0 - 2.5 (calc)   Total Bilirubin 0.4 0.2 - 1.2 mg/dL   Alkaline phosphatase (APISO) 51 37 - 153 U/L   AST 17 10 - 35 U/L   ALT 14 6 - 29 U/L  Lipid panel  Result Value Ref Range   Cholesterol 226 (H) <200 mg/dL   HDL 64 > OR = 50 mg/dL   Triglycerides 68 <150 mg/dL   LDL Cholesterol (Calc) 146 (H) mg/dL (calc)   Total CHOL/HDL Ratio 3.5 <5.0 (calc)   Non-HDL Cholesterol (Calc) 162 (H) <130 mg/dL (calc)  TSH  Result Value Ref Range   TSH 2.71 0.40 - 4.50 mIU/L      Assessment & Plan:   Problem List Items Addressed  This Visit     Elevated hemoglobin A1c   Essential hypertension   Relevant Medications   hydrochlorothiazide (HYDRODIURIL) 25 MG tablet   losartan (COZAAR) 100 MG tablet   Hyperlipidemia   Relevant Medications   hydrochlorothiazide (HYDRODIURIL) 25 MG tablet   losartan (COZAAR) 100 MG tablet   Other Visit Diagnoses     Annual physical exam    -  Primary   Needs flu shot       Relevant Orders   Flu Vaccine QUAD 39moIM (Fluarix, Fluzone & Alfiuria Quad PF)       Updated Health Maintenance information Reviewed recent lab results with patient Encouraged improvement to lifestyle with diet and exercise Goal of weight loss  Orders Placed This Encounter  Procedures   Flu Vaccine QUAD 663moM (Fluarix, Fluzone & Alfiuria Quad PF)     Meds ordered this encounter  Medications   hydrochlorothiazide (HYDRODIURIL) 25 MG tablet    Sig: Take 1 tablet (25 mg total) by mouth daily.    Dispense:  90 tablet    Refill:  3    Keep refills on file   losartan (COZAAR) 100 MG tablet    Sig: Take 1 tablet (100 mg total) by mouth daily.    Dispense:  90 tablet    Refill:  3    Keep refills on file     Follow up plan: Return in about 1 year (around 07/27/2023) for 1 year fasting lab only then 1 week later Annual Physical.  ***Future  AlNobie PutnamDO [facility] 07/26/2022, 9:33 AM

## 2022-07-27 ENCOUNTER — Other Ambulatory Visit: Payer: Self-pay | Admitting: Family Medicine

## 2022-07-27 DIAGNOSIS — Z0000 Encounter for general adult medical examination without abnormal findings: Secondary | ICD-10-CM

## 2022-07-27 DIAGNOSIS — E782 Mixed hyperlipidemia: Secondary | ICD-10-CM

## 2022-07-27 DIAGNOSIS — R7309 Other abnormal glucose: Secondary | ICD-10-CM

## 2022-07-27 DIAGNOSIS — I10 Essential (primary) hypertension: Secondary | ICD-10-CM

## 2022-07-27 NOTE — Assessment & Plan Note (Signed)
Well-controlled Pre-DM with A1c 5.5 Concern with obesity, HTN, HLD  Plan:  1. Not on any therapy currently  2. Encourage improved lifestyle - low carb, low sugar diet, reduce portion size, continue improving regular exercise 3. Follow-up yearly A1c

## 2022-07-27 NOTE — Assessment & Plan Note (Signed)
Significant improvement LDL w lifestyle The 10-year ASCVD risk score (Arnett DK, et al., 2019) is: 6.2%  Plan: 1. Discuss ASCVD risk - agree to hold statin therapy currently still 2. Encourage improved lifestyle - low carb/cholesterol, reduce portion size, continue improving regular exercise

## 2022-07-27 NOTE — Assessment & Plan Note (Signed)
Controlled HTN No known complications      Plan:  1. Continue HCTZ '25mg'$  and Losartan '100mg'$  daily - refilled 3. Encourage improved lifestyle - low sodium diet, regular exercise 4. Continue monitor BP outside office, bring readings to next visit, if persistently >140/90 or new symptoms notify office sooner

## 2022-08-20 ENCOUNTER — Other Ambulatory Visit: Payer: Self-pay | Admitting: Family Medicine

## 2022-08-20 DIAGNOSIS — M159 Polyosteoarthritis, unspecified: Secondary | ICD-10-CM

## 2022-08-20 DIAGNOSIS — M79641 Pain in right hand: Secondary | ICD-10-CM

## 2022-08-21 NOTE — Telephone Encounter (Signed)
Requested Prescriptions  Pending Prescriptions Disp Refills  . meloxicam (MOBIC) 7.5 MG tablet [Pharmacy Med Name: MELOXICAM 7.5MG TABLETS] 90 tablet 0    Sig: TAKE 1 TABLET(7.5 MG) BY MOUTH DAILY AS NEEDED FOR PAIN     Analgesics:  COX2 Inhibitors Failed - 08/20/2022 10:49 AM      Failed - Manual Review: Labs are only required if the patient has taken medication for more than 8 weeks.      Failed - eGFR is 30 or above and within 360 days    GFR, Est African American  Date Value Ref Range Status  06/24/2020 108 > OR = 60 mL/min/1.26m Final   GFR, Est Non African American  Date Value Ref Range Status  06/24/2020 94 > OR = 60 mL/min/1.756mFinal         Passed - HGB in normal range and within 360 days    Hemoglobin  Date Value Ref Range Status  07/19/2022 14.0 11.7 - 15.5 g/dL Final         Passed - Cr in normal range and within 360 days    Creat  Date Value Ref Range Status  07/19/2022 0.63 0.50 - 1.05 mg/dL Final         Passed - HCT in normal range and within 360 days    HCT  Date Value Ref Range Status  07/19/2022 40.5 35.0 - 45.0 % Final         Passed - AST in normal range and within 360 days    AST  Date Value Ref Range Status  07/19/2022 17 10 - 35 U/L Final         Passed - ALT in normal range and within 360 days    ALT  Date Value Ref Range Status  07/19/2022 14 6 - 29 U/L Final         Passed - Patient is not pregnant      Passed - Valid encounter within last 12 months    Recent Outpatient Visits          3 weeks ago [facility]   [facility]   [facility]   2 years ago Mixed hyperlipidemia   [facility]   3 years ago Annual physical exam   [place]      Future Appointments            In 11 months [facility]

## 2022-10-10 ENCOUNTER — Other Ambulatory Visit: Payer: Self-pay | Admitting: Neurosurgery

## 2022-10-10 DIAGNOSIS — D329 Benign neoplasm of meninges, unspecified: Secondary | ICD-10-CM

## 2022-11-21 ENCOUNTER — Ambulatory Visit: Admission: RE | Admit: 2022-11-21 | Payer: BC Managed Care – PPO | Source: Ambulatory Visit

## 2022-11-27 ENCOUNTER — Ambulatory Visit: Admission: RE | Admit: 2022-11-27 | Payer: BC Managed Care – PPO | Source: Ambulatory Visit

## 2022-12-04 ENCOUNTER — Ambulatory Visit: Payer: BC Managed Care – PPO

## 2022-12-04 ENCOUNTER — Ambulatory Visit
Admission: RE | Admit: 2022-12-04 | Discharge: 2022-12-04 | Disposition: A | Discharge status: Home / Self Care | Payer: BC Managed Care – PPO | Source: Ambulatory Visit | Attending: Neurosurgery | Admitting: Neurosurgery

## 2022-12-04 DIAGNOSIS — D329 Benign neoplasm of meninges, unspecified: Secondary | ICD-10-CM | POA: Insufficient documentation

## 2022-12-04 MED ORDER — GADOBUTROL 1 MMOL/ML IV SOLN
7.5000 mL | Freq: Once | INTRAVENOUS | Status: AC | PRN
  Administered 2022-12-04: 7.5 mL via INTRAVENOUS

## 2023-01-17 ENCOUNTER — Other Ambulatory Visit: Payer: Self-pay | Admitting: Family Medicine

## 2023-01-17 DIAGNOSIS — M159 Polyosteoarthritis, unspecified: Secondary | ICD-10-CM

## 2023-01-17 DIAGNOSIS — M79641 Pain in right hand: Secondary | ICD-10-CM

## 2023-01-17 NOTE — Telephone Encounter (Signed)
Requested Prescriptions  Pending Prescriptions Disp Refills   meloxicam (MOBIC) 7.5 MG tablet [Pharmacy Med Name: MELOXICAM 7.'5MG'$  TABLETS] 90 tablet 2    Sig: TAKE 1 TABLET(7.5 MG) BY MOUTH DAILY AS NEEDED FOR PAIN     Analgesics:  COX2 Inhibitors Failed - 01/17/2023  7:09 AM      Failed - Manual Review: Labs are only required if the patient has taken medication for more than 8 weeks.      Failed - eGFR is 30 or above and within 360 days    GFR, Est African American  Date Value Ref Range Status  06/24/2020 108 > OR = 60 mL/min/1.69m Final   GFR, Est Non African American  Date Value Ref Range Status  06/24/2020 94 > OR = 60 mL/min/1.768mFinal         Passed - HGB in normal range and within 360 days    Hemoglobin  Date Value Ref Range Status  07/19/2022 14.0 11.7 - 15.5 g/dL Final         Passed - Cr in normal range and within 360 days    Creat  Date Value Ref Range Status  07/19/2022 0.63 0.50 - 1.05 mg/dL Final         Passed - HCT in normal range and within 360 days    HCT  Date Value Ref Range Status  07/19/2022 40.5 35.0 - 45.0 % Final         Passed - AST in normal range and within 360 days    AST  Date Value Ref Range Status  07/19/2022 17 10 - 35 U/L Final         Passed - ALT in normal range and within 360 days    ALT  Date Value Ref Range Status  07/19/2022 14 6 - 29 U/L Final         Passed - Patient is not pregnant      Passed - Valid encounter within last 12 months    Recent Outpatient Visits           5 months ago Annual physical exam   [facility]   [facility]   2 [facility]   3 years ago Mixed hyperlipidemia   [facility]   3 years ago [facility]  physical exam   [facility]       Future Appointments             In 6 months [place] [facility]AlDev[facility]

## 2023-07-16 ENCOUNTER — Other Ambulatory Visit: Payer: Self-pay | Admitting: Family Medicine

## 2023-07-16 DIAGNOSIS — I10 Essential (primary) hypertension: Secondary | ICD-10-CM

## 2023-07-17 NOTE — Telephone Encounter (Signed)
Requested Prescriptions  Pending Prescriptions Disp Refills   hydrochlorothiazide (HYDRODIURIL) 25 MG tablet [Pharmacy Med Name: HYDROCHLOROTHIAZIDE 25MG  TABLETS] 90 tablet 0    Sig: TAKE 1 TABLET(25 MG) BY MOUTH DAILY     Cardiovascular: Diuretics - Thiazide Failed - 07/16/2023  7:04 AM      Failed - Cr in normal range and within 180 days    Creat  Date Value Ref Range Status  07/19/2022 0.63 0.50 - 1.05 mg/dL Final         Failed - K in normal range and within 180 days    Potassium  Date Value Ref Range Status  07/19/2022 4.0 3.5 - 5.3 mmol/L Final         Failed - Na in normal range and within 180 days    Sodium  Date Value Ref Range Status  07/19/2022 139 135 - 146 mmol/L Final         Failed - Valid encounter within last 6 months    Recent Outpatient Visits           11 months ago Annual physical exam   [place] [facility] Smitty Cords, DO   1 year ago Annual physical exam   [place] [facility] Althea Charon, Netta Neat, DO   3 years ago Annual physical exam   [place] [facility] Smitty Cords, DO   3 years ago Mixed hyperlipidemia   [place] [facility] Althea Charon, Netta Neat, DO   4 years ago Annual physical exam   [place] [facility] Smitty Cords, DO       Future Appointments             In 2 weeks Althea Charon, Netta Neat, DO [facility]            Passed - Last BP in normal range    BP Readings from Last 1 Encounters:  07/26/22 130/60          losartan (COZAAR) 100 MG tablet [Pharmacy Med Name: LOSARTAN 100MG  TABLETS] 90 tablet 0    Sig: TAKE 1 TABLET(100 MG) BY MOUTH DAILY     Cardiovascular:  Angiotensin Receptor Blockers Failed - 07/16/2023  7:04 AM      Failed - Cr in normal range and within 180 days    Creat  Date Value Ref Range Status  07/19/2022 0.63 0.50 - 1.05 mg/dL Final          Failed - K in normal range and within 180 days    Potassium  Date Value Ref Range Status  07/19/2022 4.0 3.5 - 5.3 mmol/L Final         Failed - Valid encounter within last 6 months    Recent Outpatient Visits           11 months ago Annual physical exam   [place] [facility] Smitty Cords, DO   1 year ago Annual physical exam   [place] [facility] Smitty Cords, DO   3 years ago Annual physical exam   [place] [facility] Smitty Cords, DO   3 years ago Mixed hyperlipidemia   [place] [facility], Netta Neat, DO   4 years ago Annual physical exam    [facility] [place], Netta Neat, [place]  Future Appointments             In 2 weeks Althea Charon Netta Neat, DO [place] [facility]            Passed - Patient is not pregnant      Passed - Last BP in normal range    BP Readings from Last 1 Encounters:  07/26/22 130/60

## 2023-07-25 ENCOUNTER — Other Ambulatory Visit: Payer: BC Managed Care – PPO

## 2023-07-25 DIAGNOSIS — R7309 Other abnormal glucose: Secondary | ICD-10-CM

## 2023-07-25 DIAGNOSIS — Z0000 Encounter for general adult medical examination without abnormal findings: Secondary | ICD-10-CM

## 2023-07-25 DIAGNOSIS — I10 Essential (primary) hypertension: Secondary | ICD-10-CM

## 2023-07-25 DIAGNOSIS — E782 Mixed hyperlipidemia: Secondary | ICD-10-CM

## 2023-07-26 LAB — CBC WITH DIFFERENTIAL/PLATELET
Absolute Monocytes: 504 cells/uL (ref 200–950)
Basophils Absolute: 50 cells/uL (ref 0–200)
Basophils Relative: 0.9 %
Eosinophils Absolute: 140 cells/uL (ref 15–500)
Eosinophils Relative: 2.5 %
HCT: 43.1 % (ref 35.0–45.0)
Hemoglobin: 14.2 g/dL (ref 11.7–15.5)
Lymphs Abs: 1943 cells/uL (ref 850–3900)
MCH: 29.2 pg (ref 27.0–33.0)
MCHC: 32.9 g/dL (ref 32.0–36.0)
MCV: 88.5 fL (ref 80.0–100.0)
MPV: 10.2 fL (ref 7.5–12.5)
Monocytes Relative: 9 %
Neutro Abs: 2962 cells/uL (ref 1500–7800)
Neutrophils Relative %: 52.9 %
Platelets: 275 10*3/uL (ref 140–400)
RBC: 4.87 10*6/uL (ref 3.80–5.10)
RDW: 12.6 % (ref 11.0–15.0)
Total Lymphocyte: 34.7 %
WBC: 5.6 10*3/uL (ref 3.8–10.8)

## 2023-07-26 LAB — COMPLETE METABOLIC PANEL WITH GFR
AG Ratio: 1.3 (calc) (ref 1.0–2.5)
ALT: 15 U/L (ref 6–29)
AST: 17 U/L (ref 10–35)
Albumin: 4.2 g/dL (ref 3.6–5.1)
Alkaline phosphatase (APISO): 54 U/L (ref 37–153)
BUN: 13 mg/dL (ref 7–25)
CO2: 30 mmol/L (ref 20–32)
Calcium: 9.4 mg/dL (ref 8.6–10.4)
Chloride: 101 mmol/L (ref 98–110)
Creat: 0.65 mg/dL (ref 0.50–1.05)
Globulin: 3.2 g/dL (calc) (ref 1.9–3.7)
Glucose, Bld: 107 mg/dL — ABNORMAL HIGH (ref 65–99)
Potassium: 4.2 mmol/L (ref 3.5–5.3)
Sodium: 138 mmol/L (ref 135–146)
Total Bilirubin: 0.4 mg/dL (ref 0.2–1.2)
Total Protein: 7.4 g/dL (ref 6.1–8.1)
eGFR: 98 mL/min/{1.73_m2} (ref >=60)

## 2023-07-26 LAB — HEMOGLOBIN A1C
Hgb A1c MFr Bld: 5.8 % of total Hgb — ABNORMAL HIGH (ref <5.7)
Mean Plasma Glucose: 120 mg/dL
eAG (mmol/L): 6.6 mmol/L

## 2023-07-26 LAB — LIPID PANEL
Cholesterol: 239 mg/dL — ABNORMAL HIGH (ref <200)
HDL: 69 mg/dL (ref >=50)
LDL Cholesterol (Calc): 153 mg/dL (calc) — ABNORMAL HIGH
Non-HDL Cholesterol (Calc): 170 mg/dL (calc) — ABNORMAL HIGH (ref <130)
Total CHOL/HDL Ratio: 3.5 (calc) (ref <5.0)
Triglycerides: 73 mg/dL (ref <150)

## 2023-07-26 LAB — TSH: TSH: 3.55 mIU/L (ref 0.40–4.50)

## 2023-08-01 ENCOUNTER — Ambulatory Visit (INDEPENDENT_AMBULATORY_CARE_PROVIDER_SITE_OTHER): Payer: BC Managed Care – PPO | Admitting: Family Medicine

## 2023-08-01 ENCOUNTER — Encounter: Payer: Self-pay | Admitting: Family Medicine

## 2023-08-01 VITALS — BP 138/74 | HR 86 | Ht 62.0 in | Wt 179.0 lb

## 2023-08-01 DIAGNOSIS — R7309 Other abnormal glucose: Secondary | ICD-10-CM

## 2023-08-01 DIAGNOSIS — M159 Polyosteoarthritis, unspecified: Secondary | ICD-10-CM

## 2023-08-01 DIAGNOSIS — Z1211 Encounter for screening for malignant neoplasm of colon: Secondary | ICD-10-CM

## 2023-08-01 DIAGNOSIS — M79641 Pain in right hand: Secondary | ICD-10-CM

## 2023-08-01 DIAGNOSIS — E782 Mixed hyperlipidemia: Secondary | ICD-10-CM | POA: Diagnosis not present

## 2023-08-01 DIAGNOSIS — Z0000 Encounter for general adult medical examination without abnormal findings: Secondary | ICD-10-CM

## 2023-08-01 DIAGNOSIS — M79642 Pain in left hand: Secondary | ICD-10-CM

## 2023-08-01 DIAGNOSIS — Z1231 Encounter for screening mammogram for malignant neoplasm of breast: Secondary | ICD-10-CM

## 2023-08-01 DIAGNOSIS — I10 Essential (primary) hypertension: Secondary | ICD-10-CM

## 2023-08-01 MED ORDER — LOSARTAN POTASSIUM 100 MG PO TABS
100.0000 mg | ORAL_TABLET | Freq: Every day | ORAL | 3 refills | Status: DC
Start: 2023-08-01 — End: 2024-08-04

## 2023-08-01 MED ORDER — HYDROCHLOROTHIAZIDE 25 MG PO TABS
25.0000 mg | ORAL_TABLET | Freq: Every day | ORAL | 3 refills | Status: DC
Start: 2023-08-01 — End: 2024-08-04

## 2023-08-01 MED ORDER — MELOXICAM 7.5 MG PO TABS
7.5000 mg | ORAL_TABLET | Freq: Every day | ORAL | 3 refills | Status: DC | PRN
Start: 2023-08-01 — End: 2024-08-04

## 2023-08-01 NOTE — Patient Instructions (Addendum)
Thank you for coming to the office today.   Ordered the Cologuard (home kit) test for colon cancer screening. Stay tuned for further updates.  It will be shipped to you directly. If not received in 2-4 weeks, call us or the company.   If you send it back and no results are received in 2-4 weeks, call us or the company as well!   Colon Cancer Screening: - For all adults age 64+ routine colon cancer screening is highly recommended.     - Recent guidelines from American Cancer Society recommend starting age of 36 - Early detection of colon cancer is important, because often there are no warning signs or symptoms, also if found early usually it can be cured. Late stage is hard to treat.   - If Cologuard is NEGATIVE, then it is good for 3 years before next due - If Cologuard is POSITIVE, then it is strongly advised to get a Colonoscopy, which allows the GI doctor to locate the source of the cancer or polyp (even very early stage) and treat it by removing it. ------------------------- Follow instructions to collect sample, you may call the company for any help or questions, 24/7 telephone support at [phone].  ------------------------------   You have been referred for a Coronary Calcium Score Cardiac CT Scan. This is a screening test for patients aged 41-50+ with cardiovascular risk factors or who are healthy but would be interested in Cardiovascular Screening for heart disease. Even if there is a family history of heart disease, this imaging can be useful. Typically it can be done every 5+ years or at a different timeline we agree on  The scan will look at the chest and mainly focus on the heart and identify early signs of calcium build up or blockages within the heart arteries. It is not 100% accurate for identifying blockages or heart disease, but it is useful to help [place] predict who may have some early changes or be at risk in the future for a heart attack or cardiovascular  problem.  The results are reviewed by a Cardiologist and they will document the results. It should become available on MyChart. Typically the results are divided into percentiles based on other patients of the same demographic and age. So it will compare your risk to others similar to you. If you have a higher score >99 or higher percentile >75%tile, it is recommended to consider Statin cholesterol therapy and or referral to Cardiologist. I will try to help explain your results and if we have questions we can contact the Cardiologist.  You will be contacted for scheduling. Usually it is done at any imaging facility through [facility], [facility] or [facility].  The cost is $99 flat fee total and it does not go through insurance, so no authorization is required.  ---  Recent Labs    07/25/23 0818  HGBA1C 5.8*    Diet Recommendations for Preventing Diabetes   REDUCE Starchy (carb) foods include: Bread, rice, pasta, potatoes, corn, crackers, bagels, muffins, all baked goods.   FRUITS - LIMIT these HIGH sugar/carb fruits = Pineapple, Watermelon, Bananas - OKAY with these MEDIUM sugar/carb fruits = Citrus, Oranges, Grapes - PREFER these LOW sugar/carb fruits = Apples, Berries, Pears, Plums  Protein foods include: Meat, fish, poultry, eggs, dairy foods, and beans such as pinto and kidney beans (beans also provide carbohydrate).   1. Eat at least 3 meals and 1-2 snacks per day. Never go more than 4-5 hours  while awake without eating.   2. Limit starchy foods to TWO per meal and ONE per snack. ONE portion of a starchy  food is equal to the following:   - ONE slice of bread (or its equivalent, such as half of a hamburger bun).   - 1/2 cup of a "scoopable" starchy food such as potatoes or rice.   - 1 OUNCE (28 grams) of starchy snacks (crackers or pretzels, look on label).   - 15 grams of carbohydrate as shown on food label.   3. Both lunch and dinner should  include a protein food, a carb food, and vegetables.   - Obtain twice as many veg's as protein or carbohydrate foods for both lunch and dinner.   - Try to keep frozen veg's on hand for a quick vegetable serving.     - Fresh or frozen veg's are best.   4. Breakfast should always include protein.      Please schedule a Follow-up Appointment to: Return in about 1 year (around 07/31/2024) for 1 year Annual Physical.  If you have any other questions or concerns, please feel free to call the office or send a message through MyChart. You may also schedule an earlier appointment if necessary.  Additionally, you may be receiving a survey about your experience at our office within a few days to 1 week by e-mail or mail. We value your feedback.  Saralyn Pilar, DO [facility] [place], [place]

## 2023-08-01 NOTE — Assessment & Plan Note (Signed)
Controlled HTN No known complications      Plan:  1. Continue HCTZ 25mg  and Losartan 100mg  daily - refilled 3. Encourage improved lifestyle - low sodium diet, regular exercise 4. Continue monitor BP outside office, bring readings to next visit, if persistently >140/90 or new symptoms notify office sooner

## 2023-08-01 NOTE — Progress Notes (Signed)
Subjective:    Patient ID: [name], female    DOB: 1961/10/24, 62 y.o.   MRN: [id number]  [name] is a 62 y.o. female presenting on 08/01/2023 for Annual Exam   HPI  Discussed the use of AI scribe software for clinical note transcription with the patient, who gave verbal consent to proceed.          Here for Annual Physical and Lab Review.  She takes Dotera vitamins. And switched some of her supplements. New natural mixture of spices and anti oxidants    CHRONIC HTN: Not checking BP recently. Current Meds - HCTZ 25mg , Losartan 100mg  Reports good compliance, took meds today. Tolerating well, w/o complaints. Drinks some coffee, decaf Admits some swelling episodic worse left foot, she improves diet to avoid, worse on hot days Denies CP, dyspnea, HA, edema currently, dizziness / lightheadedness   Elevated A1c / Hyperlipidemia Elevated LDL / BMI >32 A1c 5.5 to 5.8 range Last lab A1c 5.8 Lab showed LDL 150s still unchanged Meds: Never on statin therapy. Or other med Reports good compliance. Tolerating well w/o side-effects Currently on ARB - Diet (Significant healthier diet improved) Exercise (walking intermittently with some exercise bike and some walking) Denies hypoglycemia  ASCVD RISK The 10-year ASCVD risk score (Arnett DK, et al., 2019) is: 7.7% Interested in Coronary Calcium Score testing for heart disease.   Osteoarthritis / Hand Joint Pain PMH     Health Maintenance:   She will pause Flu vaccine today   Due for Shingles vaccine, still considering this.   - Colon CA Screening: Last Colonoscopy 11/23/17 (done by [facility]), results were reported negative without polyps, good for 5 years. Currently asymptomatic. Family history with Father colon CA.  DUE or screening now for Cologuard, she prefers this since last colonoscopy had no polyps   Mammogram abnormal 02/2022, repeat US diagnostic, normal, repeat 1 yr, now in 2024. Order  placed.   Cervical ca screening last done 02/17/22      08/01/2023    9:11 AM 07/27/2022    7:01 PM 07/20/2021    9:11 AM  Depression screen PHQ 2/9  Decreased Interest 0 0 0  Down, Depressed, Hopeless 0 0 0  PHQ - 2 Score 0 0 0  Altered sleeping   1  Tired, decreased energy   2  Change in appetite   0  Feeling bad or failure about yourself    0  Trouble concentrating   0  Moving slowly or fidgety/restless   0  Suicidal thoughts   0  PHQ-9 Score   3  Difficult doing work/chores   Not difficult at all    Past Medical History:  Diagnosis Date   Essential hypertension    controlled with medication;    Family history of colon cancer    father   Osteoarthritis    Past Surgical History:  Procedure Laterality Date   BRAIN SURGERY  11/2016   removal of meningioma   CERVICAL BIOPSY  W/ LOOP ELECTRODE EXCISION  1999   COLONOSCOPY     TONSILLECTOMY AND ADENOIDECTOMY     Social History   Socioeconomic History   Marital status: Married    Spouse name: Not on file   Number of children: 2   Years of education: Not on file   Highest education level: Not on file  Occupational History   Occupation: Geographical information systems officer  Tobacco Use   Smoking status: Never  Smokeless tobacco: Never  Vaping Use   Vaping status: Never Used  Substance and Sexual Activity   Alcohol use: No    Alcohol/week: 0.0 standard drinks of alcohol   Drug use: No   Sexual activity: Yes    Birth control/protection: Post-menopausal  Other Topics Concern   Not on file  Social History Narrative   Not on file   Social Determinants of Health   Financial Resource Strain: Not on file  Food Insecurity: Not on file  Transportation Needs: Not on file  Physical Activity: Not on file  Stress: Not on file  Social Connections: Not on file  Intimate Partner Violence: Not on file   Family History  Problem Relation Age of Onset   Hypertension Mother    Stroke Mother        2012   Hypertension Father     Cancer - Colon Father    Diabetes Brother    Breast cancer Maternal Aunt 50   Diabetes Maternal Aunt    Current Outpatient Medications on File Prior to Visit  Medication Sig   acetaminophen (TYLENOL) 325 MG tablet Take 650 mg by mouth every 6 (six) hours as needed.   diclofenac sodium (VOLTAREN) 1 % GEL Apply 2 g topically 3 (three) times daily.   fexofenadine (ALLEGRA) 60 MG tablet Take 60 mg by mouth daily.   fluticasone (FLONASE) 50 MCG/ACT nasal spray    Multiple Vitamin (MULTIVITAMIN PO) Take by mouth. DoTerra   No current facility-administered medications on file prior to visit.    Review of Systems  Constitutional:  Negative for activity change, appetite change, chills, diaphoresis, fatigue and fever.  HENT:  Negative for congestion and hearing loss.   Eyes:  Negative for visual disturbance.  Respiratory:  Negative for cough, chest tightness, shortness of breath and wheezing.   Cardiovascular:  Negative for chest pain, palpitations and leg swelling.  Gastrointestinal:  Negative for abdominal pain, constipation, diarrhea, nausea and vomiting.  Genitourinary:  Negative for dysuria, frequency and hematuria.  Musculoskeletal:  Negative for arthralgias and neck pain.  Skin:  Negative for rash.  Neurological:  Negative for dizziness, weakness, light-headedness, numbness and headaches.  Hematological:  Negative for adenopathy.  Psychiatric/Behavioral:  Negative for behavioral problems, dysphoric mood and sleep disturbance.    Per HPI unless specifically indicated above      Objective:    BP 138/74 (BP Location: Left Arm, Cuff Size: Normal)   Pulse 86   Ht 5\' 2"  (1.575 m)   Wt 179 lb (81.2 kg)   SpO2 100%   BMI 32.74 kg/m   Wt Readings from Last 3 Encounters:  08/01/23 179 lb (81.2 kg)  07/26/22 180 lb 9.6 oz (81.9 kg)  02/15/22 182 lb 8 oz (82.8 kg)    Physical Exam Vitals and nursing note reviewed.  Constitutional:      General: She is not in acute distress.     Appearance: She is well-developed. She is not diaphoretic.     Comments: Well-appearing, comfortable, cooperative  HENT:     Head: Normocephalic and atraumatic.  Eyes:     General:        Right eye: No discharge.        Left eye: No discharge.     Conjunctiva/sclera: Conjunctivae normal.     Pupils: Pupils are equal, round, and reactive to light.  Neck:     Thyroid: No thyromegaly.  Cardiovascular:     Rate and Rhythm: Normal rate and regular rhythm.  Pulses: Normal pulses.     Heart sounds: Normal heart sounds. No murmur heard. Pulmonary:     Effort: Pulmonary effort is normal. No respiratory distress.     Breath sounds: Normal breath sounds. No wheezing or rales.  Abdominal:     General: Bowel sounds are normal. There is no distension.     Palpations: Abdomen is soft. There is no mass.     Tenderness: There is no abdominal tenderness.  Musculoskeletal:        General: No tenderness. Normal range of motion.     Cervical back: Normal range of motion and neck supple.     Comments: Upper / Lower Extremities: - Normal muscle tone, strength bilateral upper extremities 5/5, lower extremities 5/5  Lymphadenopathy:     Cervical: No cervical adenopathy.  Skin:    General: Skin is warm and dry.     Findings: No erythema or rash.  Neurological:     Mental Status: She is alert and oriented to person, place, and time.     Comments: Distal sensation intact to light touch all extremities  Psychiatric:        Mood and Affect: Mood normal.        Behavior: Behavior normal.        Thought Content: Thought content normal.     Comments: Well groomed, good eye contact, normal speech and thoughts      Results for orders placed or performed in visit on 07/25/23  TSH  Result Value Ref Range   TSH 3.55 0.40 - 4.50 mIU/L  Hemoglobin A1c  Result Value Ref Range   Hgb A1c MFr Bld 5.8 (H) <5.7 % of total Hgb   Mean Plasma Glucose 120 mg/dL   eAG (mmol/L) 6.6 mmol/L  Lipid panel  Result  Value Ref Range   Cholesterol 239 (H) <200 mg/dL   HDL 69 > OR = 50 mg/dL   Triglycerides 73 <161 mg/dL   LDL Cholesterol (Calc) 153 (H) mg/dL (calc)   Total CHOL/HDL Ratio 3.5 <5.0 (calc)   Non-HDL Cholesterol (Calc) 170 (H) <130 mg/dL (calc)  CBC with Differential/Platelet  Result Value Ref Range   WBC 5.6 3.8 - 10.8 Thousand/uL   RBC 4.87 3.80 - 5.10 Million/uL   Hemoglobin 14.2 11.7 - 15.5 g/dL   HCT 09.6 04.5 - 40.9 %   MCV 88.5 80.0 - 100.0 fL   MCH 29.2 27.0 - 33.0 pg   MCHC 32.9 32.0 - 36.0 g/dL   RDW 81.1 91.4 - 78.2 %   Platelets 275 140 - 400 Thousand/uL   MPV 10.2 7.5 - 12.5 fL   Neutro Abs 2,962 1,500 - 7,800 cells/uL   Lymphs Abs 1,943 850 - 3,900 cells/uL   Absolute Monocytes 504 200 - 950 cells/uL   Eosinophils Absolute 140 15 - 500 cells/uL   Basophils Absolute 50 0 - 200 cells/uL   Neutrophils Relative % 52.9 %   Total Lymphocyte 34.7 %   Monocytes Relative 9.0 %   Eosinophils Relative 2.5 %   Basophils Relative 0.9 %  COMPLETE METABOLIC PANEL WITH GFR  Result Value Ref Range   Glucose, Bld 107 (H) 65 - 99 mg/dL   BUN 13 7 - 25 mg/dL   Creat 9.56 2.13 - 0.86 mg/dL   eGFR 98 > OR = 60 VH/QIO/9.62X5   BUN/Creatinine Ratio SEE NOTE: 6 - 22 (calc)   Sodium 138 135 - 146 mmol/L   Potassium 4.2 3.5 - 5.3 mmol/L  Chloride 101 98 - 110 mmol/L   CO2 30 20 - 32 mmol/L   Calcium 9.4 8.6 - 10.4 mg/dL   Total Protein 7.4 6.1 - 8.1 g/dL   Albumin 4.2 3.6 - 5.1 g/dL   Globulin 3.2 1.9 - 3.7 g/dL (calc)   AG Ratio 1.3 1.0 - 2.5 (calc)   Total Bilirubin 0.4 0.2 - 1.2 mg/dL   Alkaline phosphatase (APISO) 54 37 - 153 U/L   AST 17 10 - 35 U/L   ALT 15 6 - 29 U/L      Assessment & Plan:   Problem List Items Addressed This Visit     Bilateral hand pain   Relevant Medications   meloxicam (MOBIC) 7.5 MG tablet   Elevated hemoglobin A1c    Mild elevated A1c to 5.8 Concern with obesity, HTN, HLD  Plan:  1. Not on any therapy currently  2. Encourage improved  lifestyle - low carb, low sugar diet, reduce portion size, continue improving regular exercise 3. Follow-up yearly A1c       Essential hypertension    Controlled HTN No known complications      Plan:  1. Continue HCTZ 25mg  and Losartan 100mg  daily refilled 3. Encourage improved lifestyle - low sodium diet, regular exercise 4. Continue monitor BP outside office, bring readings to next visit, if persistently >140/90 or new symptoms notify office sooner      Relevant Medications   hydrochlorothiazide (HYDRODIURIL) 25 MG tablet   losartan (COZAAR) 100 MG tablet   Other Relevant Orders   CT CARDIAC SCORING (SELF PAY ONLY)   Hyperlipidemia    Still elevated LDL now again at 150+ The 81-XBJY ASCVD risk score (Arnett DK, et al., 2019) is: 7.7%  Plan: 1. Discuss ASCVD risk - agree to hold statin therapy currently still but we will pursue Coronary Calcium Score CT, may reconsider Statin therapy  2. Encourage improved lifestyle - low carb/cholesterol, reduce portion size, continue improving regular exercise      Relevant Medications   hydrochlorothiazide (HYDRODIURIL) 25 MG tablet   losartan (COZAAR) 100 MG tablet   Other Relevant Orders   CT CARDIAC SCORING (SELF PAY ONLY)   Osteoarthritis    Stable chronic, multiple joints Improved on meds Improved with some reduce carb diet Refill meloxicam, use diclofenac prn      Relevant Medications   meloxicam (MOBIC) 7.5 MG tablet   Screening for breast cancer   Relevant Orders   MM 3D SCREENING MAMMOGRAM BILATERAL BREAST   Screening for colon cancer   Relevant Orders   Cologuard   Other Visit Diagnoses     Annual physical exam    -  Primary       Updated Health Maintenance information Reviewed recent lab results with patient Encouraged improvement to lifestyle with diet and exercise Goal of weight loss      Colon Cancer Screening Family history of colon cancer and previous polyps found. Last colonoscopy was five years  ago. -Order Cologuard for colon cancer screening.  General Health Maintenance -Declined flu shot for this year. -Order heart scan for cardiovascular disease screening. -Schedule mammogram for breast cancer screening. -Check blood pressure regularly and monitor for any changes. -Continue current medications and supplements. -Follow-up in one year or sooner if cholesterol treatment is initiated.      Orders Placed This Encounter  Procedures   MM 3D SCREENING MAMMOGRAM BILATERAL BREAST    Standing Status:   Future    Standing Expiration Date:  07/31/2024    Order Specific Question:   Reason for Exam (SYMPTOM  OR DIAGNOSIS REQUIRED)    Answer:   Screening bilateral 3D Mammogram Tomo    Order Specific Question:   Preferred imaging location?    Answer:   [facility]   CT CARDIAC SCORING (SELF PAY ONLY)    Standing Status:   Future    Standing Expiration Date:   07/31/2024    Order Specific Question:   Preferred imaging location?    Answer:   [facility]   Cologuard     Meds ordered this encounter  Medications   hydrochlorothiazide (HYDRODIURIL) 25 MG tablet    Sig: Take 1 tablet (25 mg total) by mouth daily.    Dispense:  90 tablet    Refill:  3    Add refills   losartan (COZAAR) 100 MG tablet    Sig: Take 1 tablet (100 mg total) by mouth daily.    Dispense:  90 tablet    Refill:  3   meloxicam (MOBIC) 7.5 MG tablet    Sig: Take 1 tablet (7.5 mg total) by mouth daily as needed for pain.    Dispense:  90 tablet    Refill:  3      Follow up plan: Return in about 1 year (around 07/31/2024) for 1 year Annual Physical.  Will need 3 month visit if we agree to start Statin after Coronary CT  Saralyn Pilar, DO [facility] 08/01/2023, 8:44 AM

## 2023-08-01 NOTE — Assessment & Plan Note (Signed)
Stable chronic, multiple joints Improved on meds Improved with some reduce carb diet Refill meloxicam, use diclofenac prn

## 2023-08-01 NOTE — Assessment & Plan Note (Signed)
Still elevated LDL now again at 150+ The 19-JYNW ASCVD risk score (Arnett DK, et al., 2019) is: 7.7%  Plan: 1. Discuss ASCVD risk - agree to hold statin therapy currently still but we will pursue Coronary Calcium Score CT, may reconsider Statin therapy  2. Encourage improved lifestyle - low carb/cholesterol, reduce portion size, continue improving regular exercise

## 2023-08-01 NOTE — Assessment & Plan Note (Signed)
Mild elevated A1c to 5.8 Concern with obesity, HTN, HLD  Plan:  1. Not on any therapy currently  2. Encourage improved lifestyle - low carb, low sugar diet, reduce portion size, continue improving regular exercise 3. Follow-up yearly A1c

## 2023-08-06 ENCOUNTER — Ambulatory Visit
Admission: RE | Admit: 2023-08-06 | Discharge: 2023-08-06 | Disposition: A | Discharge status: Home / Self Care | Payer: BC Managed Care – PPO | Source: Ambulatory Visit | Attending: Family Medicine | Admitting: Family Medicine

## 2023-08-06 DIAGNOSIS — I10 Essential (primary) hypertension: Secondary | ICD-10-CM

## 2023-08-06 DIAGNOSIS — E782 Mixed hyperlipidemia: Secondary | ICD-10-CM

## 2023-08-10 LAB — COLOGUARD: COLOGUARD: NEGATIVE

## 2023-10-23 ENCOUNTER — Ambulatory Visit
Admission: RE | Admit: 2023-10-23 | Discharge: 2023-10-23 | Disposition: A | Discharge status: Home / Self Care | Payer: BC Managed Care – PPO | Source: Ambulatory Visit | Attending: Family Medicine | Admitting: Family Medicine

## 2023-10-23 DIAGNOSIS — Z1231 Encounter for screening mammogram for malignant neoplasm of breast: Secondary | ICD-10-CM | POA: Insufficient documentation

## 2024-07-25 ENCOUNTER — Other Ambulatory Visit: Payer: Self-pay | Admitting: Family Medicine

## 2024-07-25 DIAGNOSIS — Z0000 Encounter for general adult medical examination without abnormal findings: Secondary | ICD-10-CM

## 2024-07-25 DIAGNOSIS — R7309 Other abnormal glucose: Secondary | ICD-10-CM

## 2024-07-25 DIAGNOSIS — M150 Primary generalized (osteo)arthritis: Secondary | ICD-10-CM

## 2024-07-25 DIAGNOSIS — I10 Essential (primary) hypertension: Secondary | ICD-10-CM

## 2024-07-25 DIAGNOSIS — E782 Mixed hyperlipidemia: Secondary | ICD-10-CM

## 2024-07-28 ENCOUNTER — Other Ambulatory Visit: Payer: Self-pay

## 2024-07-29 ENCOUNTER — Ambulatory Visit: Payer: Self-pay | Admitting: Family Medicine

## 2024-07-29 LAB — CBC WITH DIFFERENTIAL/PLATELET
Absolute Lymphocytes: 2059 {cells}/uL (ref 850–3900)
Absolute Monocytes: 413 {cells}/uL (ref 200–950)
Basophils Absolute: 41 {cells}/uL (ref 0–200)
Basophils Relative: 0.7 %
Eosinophils Absolute: 83 {cells}/uL (ref 15–500)
Eosinophils Relative: 1.4 %
HCT: 41.3 % (ref 35.0–45.0)
Hemoglobin: 14 g/dL (ref 11.7–15.5)
MCH: 30 pg (ref 27.0–33.0)
MCHC: 33.9 g/dL (ref 32.0–36.0)
MCV: 88.4 fL (ref 80.0–100.0)
MPV: 10.4 fL (ref 7.5–12.5)
Monocytes Relative: 7 %
Neutro Abs: 3304 {cells}/uL (ref 1500–7800)
Neutrophils Relative %: 56 %
Platelets: 281 Thousand/uL (ref 140–400)
RBC: 4.67 Million/uL (ref 3.80–5.10)
RDW: 12.6 % (ref 11.0–15.0)
Total Lymphocyte: 34.9 %
WBC: 5.9 Thousand/uL (ref 3.8–10.8)

## 2024-07-29 LAB — LIPID PANEL
Cholesterol: 238 mg/dL — ABNORMAL HIGH (ref <200)
HDL: 65 mg/dL (ref >=50)
LDL Cholesterol (Calc): 154 mg/dL — ABNORMAL HIGH
Non-HDL Cholesterol (Calc): 173 mg/dL — ABNORMAL HIGH (ref <130)
Total CHOL/HDL Ratio: 3.7 (calc) (ref <5.0)
Triglycerides: 83 mg/dL (ref <150)

## 2024-07-29 LAB — COMPREHENSIVE METABOLIC PANEL WITH GFR
AG Ratio: 1.3 (calc) (ref 1.0–2.5)
ALT: 12 U/L (ref 6–29)
AST: 19 U/L (ref 10–35)
Albumin: 4.4 g/dL (ref 3.6–5.1)
Alkaline phosphatase (APISO): 53 U/L (ref 37–153)
BUN: 12 mg/dL (ref 7–25)
CO2: 31 mmol/L (ref 20–32)
Calcium: 9.3 mg/dL (ref 8.6–10.4)
Chloride: 100 mmol/L (ref 98–110)
Creat: 0.61 mg/dL (ref 0.50–1.05)
Globulin: 3.5 g/dL (ref 1.9–3.7)
Glucose, Bld: 111 mg/dL — ABNORMAL HIGH (ref 65–99)
Potassium: 4.2 mmol/L (ref 3.5–5.3)
Sodium: 137 mmol/L (ref 135–146)
Total Bilirubin: 0.5 mg/dL (ref 0.2–1.2)
Total Protein: 7.9 g/dL (ref 6.1–8.1)
eGFR: 99 mL/min/1.73m2 (ref >=60)

## 2024-07-29 LAB — TSH: TSH: 2.4 m[IU]/L (ref 0.40–4.50)

## 2024-07-29 LAB — HEMOGLOBIN A1C
Hgb A1c MFr Bld: 5.8 % — ABNORMAL HIGH (ref <5.7)
Mean Plasma Glucose: 120 mg/dL
eAG (mmol/L): 6.6 mmol/L

## 2024-08-04 ENCOUNTER — Encounter: Payer: Self-pay | Admitting: Family Medicine

## 2024-08-04 ENCOUNTER — Ambulatory Visit (INDEPENDENT_AMBULATORY_CARE_PROVIDER_SITE_OTHER): Payer: Self-pay | Admitting: Family Medicine

## 2024-08-04 ENCOUNTER — Other Ambulatory Visit: Payer: Self-pay | Admitting: Family Medicine

## 2024-08-04 VITALS — BP 132/72 | HR 61 | Ht 62.0 in | Wt 181.4 lb

## 2024-08-04 DIAGNOSIS — M79641 Pain in right hand: Secondary | ICD-10-CM | POA: Diagnosis not present

## 2024-08-04 DIAGNOSIS — Z0000 Encounter for general adult medical examination without abnormal findings: Secondary | ICD-10-CM

## 2024-08-04 DIAGNOSIS — I10 Essential (primary) hypertension: Secondary | ICD-10-CM

## 2024-08-04 DIAGNOSIS — M79642 Pain in left hand: Secondary | ICD-10-CM

## 2024-08-04 DIAGNOSIS — Z780 Asymptomatic menopausal state: Secondary | ICD-10-CM

## 2024-08-04 DIAGNOSIS — M150 Primary generalized (osteo)arthritis: Secondary | ICD-10-CM

## 2024-08-04 DIAGNOSIS — Z1231 Encounter for screening mammogram for malignant neoplasm of breast: Secondary | ICD-10-CM

## 2024-08-04 DIAGNOSIS — E782 Mixed hyperlipidemia: Secondary | ICD-10-CM

## 2024-08-04 DIAGNOSIS — R7309 Other abnormal glucose: Secondary | ICD-10-CM

## 2024-08-04 MED ORDER — MELOXICAM 7.5 MG PO TABS: 7.5000 mg | ORAL_TABLET | Freq: Every day | ORAL | 3 refills | Status: AC | PRN

## 2024-08-04 MED ORDER — HYDROCHLOROTHIAZIDE 25 MG PO TABS: 25.0000 mg | ORAL_TABLET | Freq: Every day | ORAL | 3 refills | Status: AC

## 2024-08-04 MED ORDER — LOSARTAN POTASSIUM 100 MG PO TABS
100.0000 mg | ORAL_TABLET | Freq: Every day | ORAL | 3 refills | Status: AC
Start: 2024-08-04 — End: ?

## 2024-08-04 NOTE — Progress Notes (Signed)
 Subjective:    Patient ID: [name], female    DOB: June 06, 1961, 63 y.o.   MRN: [id number]  [name] is a 63 y.o. female presenting on 08/04/2024 for Annual Exam   HPI Discussed the use of AI scribe software for clinical note transcription with the patient, who gave verbal consent to proceed.  History of Present Illness   [name] is a 63 year old female who presents for an annual physical exam and medication refills.     CHRONIC HTN: Controlled lately Current Meds - HCTZ 25mg , Losartan  100mg  Reports good compliance, took meds today. Tolerating well, w/o complaints. Drinks some coffee, decaf Denies CP, dyspnea, HA, edema currently, dizziness / lightheadedness   Elevated A1c / Hyperlipidemia Elevated LDL / BMI >33 A1c 5.5 to 5.8 range Last lab A1c 5.8, stable or unchanged Lab showed LDL 150s still unchanged now at 154 Meds: Never on statin therapy. Or other med Reports good compliance. Tolerating well w/o side-effects Currently on ARB - Diet (Significant healthier diet improved) Exercise (walking intermittently with some exercise bike and some walking) Denies hypoglycemia  She does better gluten free diet options, not always following She has changed sweetener for coffee and other things, using honey more    Osteoarthritis / Hand Joint Pain PMH   Musculoskeletal symptoms - Joint discomfort, particularly in the hands, improves with dietary modifications. - Avoids gluten and red meat due to joint discomfort, consuming chicken and fish instead. - Uses a topical cream with essential oils for joint pain.    Dietary modifications and supplement use - Reduced candy intake, replacing it with homemade protein bites. - Mindful of sugar intake, using honey and Stevia as sweeteners. - Takes a multivitamin, Z Omega, and Alpha CRS supplements.  Cardiovascular risk assessment - Cardiac calcium scan performed one year ago showed a zero score.  Dermatologic history -  Several skin lesions removed in the past two years.  Health Maintenance:   Due for Shingles vaccine, still considering this.   - Colon CA Screening: Last Colonoscopy 11/23/17 (done by Dr Aundria [facility] at [facility]), results were reported negative without polyps, good for 5 years. Currently asymptomatic. Family history with Father colon CA. - Last Cologuard done 08/05/23 negative, repeat 3 years 2027   Mammogram negative 10/2023, due 10/2024   Cervical ca screening last done 02/17/22      08/04/2024    9:17 AM 08/01/2023    9:11 AM 07/27/2022    7:01 PM  Depression screen PHQ 2/9  Decreased Interest 0 0 0  Down, Depressed, Hopeless 0 0 0  PHQ - 2 Score 0 0 0  Altered sleeping 0    Tired, decreased energy 0    Change in appetite 0    Feeling bad or failure about yourself  0    Trouble concentrating 0    Moving slowly or fidgety/restless 0    Suicidal thoughts 0    PHQ-9 Score 0    Difficult doing work/chores Not difficult at all         08/04/2024    9:17 AM 08/01/2023    9:11 AM 07/20/2021    9:11 AM  GAD 7 : Generalized Anxiety Score  Nervous, Anxious, on Edge 0 0 0  Control/stop worrying 0 0 0  Worry too much - different things 0 0 0  Trouble relaxing 0 0 0  Restless 0 0 0  Easily annoyed or irritable 0 0 0  Afraid - awful  might happen 0 0 0  Total GAD 7 Score 0 0 0  Anxiety Difficulty Not difficult at all  Not difficult at all     Past Medical History:  Diagnosis Date   Essential hypertension    controlled with medication;    Family history of colon cancer    father   Osteoarthritis    Past Surgical History:  Procedure Laterality Date   BRAIN SURGERY  11/2016   removal of meningioma   CERVICAL BIOPSY  W/ LOOP ELECTRODE EXCISION  1999   COLONOSCOPY     TONSILLECTOMY AND ADENOIDECTOMY     Social History   Socioeconomic History   Marital status: Married    Spouse name: Not on file   Number of children: 2   Years of education: Not on file   Highest  education level: Not on file  Occupational History   Occupation: Geographical information systems officer  Tobacco Use   Smoking status: Never   Smokeless tobacco: Never  Vaping Use   Vaping status: Never Used  Substance and Sexual Activity   Alcohol use: No    Alcohol/week: 0.0 standard drinks of alcohol   Drug use: No   Sexual activity: Yes    Birth control/protection: Post-menopausal  Other Topics Concern   Not on file  Social History Narrative   Not on file   Social Drivers of Health   Financial Resource Strain: Not on file  Food Insecurity: Not on file  Transportation Needs: Not on file  Physical Activity: Not on file  Stress: Not on file  Social Connections: Not on file  Intimate Partner Violence: Not on file   Family History  Problem Relation Age of Onset   Hypertension Mother    Stroke Mother        2012   Hypertension Father    Cancer - Colon Father    Diabetes Brother    Breast cancer Maternal Aunt 50   Diabetes Maternal Aunt    Current Outpatient Medications on File Prior to Visit  Medication Sig   acetaminophen (TYLENOL) 325 MG tablet Take 650 mg by mouth every 6 (six) hours as needed.   diclofenac  sodium (VOLTAREN ) 1 % GEL Apply 2 g topically 3 (three) times daily.   fexofenadine (ALLEGRA) 60 MG tablet Take 60 mg by mouth daily.   fluticasone (FLONASE) 50 MCG/ACT nasal spray    Multiple Vitamin (MULTIVITAMIN PO) Take by mouth. DoTerra   OVER THE COUNTER MEDICATION XEO MEGA   OVER THE COUNTER MEDICATION Alpha CRS+   OVER THE COUNTER MEDICATION Microplex VMZ   No current facility-administered medications on file prior to visit.    Review of Systems  Constitutional:  Negative for activity change, appetite change, chills, diaphoresis, fatigue and fever.  HENT:  Negative for congestion and hearing loss.   Eyes:  Negative for visual disturbance.  Respiratory:  Negative for cough, chest tightness, shortness of breath and wheezing.   Cardiovascular:  Negative for chest  pain, palpitations and leg swelling.  Gastrointestinal:  Negative for abdominal pain, constipation, diarrhea, nausea and vomiting.  Genitourinary:  Negative for dysuria, frequency and hematuria.  Musculoskeletal:  Negative for arthralgias and neck pain.  Skin:  Negative for rash.  Neurological:  Negative for dizziness, weakness, light-headedness, numbness and headaches.  Hematological:  Negative for adenopathy.  Psychiatric/Behavioral:  Negative for behavioral problems, dysphoric mood and sleep disturbance.    Per HPI unless specifically indicated above     Objective:    BP 132/72 (BP  Location: Left Arm, Patient Position: Sitting, Cuff Size: Normal)   Pulse 61   Ht 5' 2 (1.575 m)   Wt 181 lb 6.4 oz (82.3 kg)   SpO2 98%   BMI 33.18 kg/m   Wt Readings from Last 3 Encounters:  08/04/24 181 lb 6.4 oz (82.3 kg)  08/01/23 179 lb (81.2 kg)  07/26/22 180 lb 9.6 oz (81.9 kg)    Physical Exam Vitals and nursing note reviewed.  Constitutional:      General: She is not in acute distress.    Appearance: She is well-developed. She is not diaphoretic.     Comments: Well-appearing, comfortable, cooperative  HENT:     Head: Normocephalic and atraumatic.  Eyes:     General:        Right eye: No discharge.        Left eye: No discharge.     Conjunctiva/sclera: Conjunctivae normal.     Pupils: Pupils are equal, round, and reactive to light.  Neck:     Thyroid : No thyromegaly.  Cardiovascular:     Rate and Rhythm: Normal rate and regular rhythm.     Pulses: Normal pulses.     Heart sounds: Normal heart sounds. No murmur heard. Pulmonary:     Effort: Pulmonary effort is normal. No respiratory distress.     Breath sounds: Normal breath sounds. No wheezing or rales.  Abdominal:     General: Bowel sounds are normal. There is no distension.     Palpations: Abdomen is soft. There is no mass.     Tenderness: There is no abdominal tenderness.  Musculoskeletal:        General: No tenderness.  Normal range of motion.     Cervical back: Normal range of motion and neck supple.     Right lower leg: No edema.     Left lower leg: No edema.     Comments: Upper / Lower Extremities: - Normal muscle tone, strength bilateral upper extremities 5/5, lower extremities 5/5  Lymphadenopathy:     Cervical: No cervical adenopathy.  Skin:    General: Skin is warm and dry.     Findings: No erythema or rash.  Neurological:     Mental Status: She is alert and oriented to person, place, and time.     Comments: Distal sensation intact to light touch all extremities  Psychiatric:        Mood and Affect: Mood normal.        Behavior: Behavior normal.        Thought Content: Thought content normal.     Comments: Well groomed, good eye contact, normal speech and thoughts       Results for orders placed or performed in visit on 07/25/24  TSH   Collection Time: 07/28/24 10:05 AM  Result Value Ref Range   TSH 2.40 0.40 - 4.50 mIU/L  Lipid panel   Collection Time: 07/28/24 10:05 AM  Result Value Ref Range   Cholesterol 238 (H) <200 mg/dL   HDL 65 > OR = 50 mg/dL   Triglycerides 83 <849 mg/dL   LDL Cholesterol (Calc) 154 (H) mg/dL (calc)   Total CHOL/HDL Ratio 3.7 <5.0 (calc)   Non-HDL Cholesterol (Calc) 173 (H) <130 mg/dL (calc)  Hemoglobin J8r   Collection Time: 07/28/24 10:05 AM  Result Value Ref Range   Hgb A1c MFr Bld 5.8 (H) <5.7 %   Mean Plasma Glucose 120 mg/dL   eAG (mmol/L) 6.6 mmol/L  CBC with Differential/Platelet  Collection Time: 07/28/24 10:05 AM  Result Value Ref Range   WBC 5.9 3.8 - 10.8 Thousand/uL   RBC 4.67 3.80 - 5.10 Million/uL   Hemoglobin 14.0 11.7 - 15.5 g/dL   HCT 58.6 64.9 - 54.9 %   MCV 88.4 80.0 - 100.0 fL   MCH 30.0 27.0 - 33.0 pg   MCHC 33.9 32.0 - 36.0 g/dL   RDW 87.3 88.9 - 84.9 %   Platelets 281 140 - 400 Thousand/uL   MPV 10.4 7.5 - 12.5 fL   Neutro Abs 3,304 1,500 - 7,800 cells/uL   Absolute Lymphocytes 2,059 850 - 3,900 cells/uL   Absolute  Monocytes 413 200 - 950 cells/uL   Eosinophils Absolute 83 15 - 500 cells/uL   Basophils Absolute 41 0 - 200 cells/uL   Neutrophils Relative % 56 %   Total Lymphocyte 34.9 %   Monocytes Relative 7.0 %   Eosinophils Relative 1.4 %   Basophils Relative 0.7 %  Comprehensive metabolic panel with GFR   Collection Time: 07/28/24 10:05 AM  Result Value Ref Range   Glucose, Bld 111 (H) 65 - 99 mg/dL   BUN 12 7 - 25 mg/dL   Creat 9.38 9.49 - 8.94 mg/dL   eGFR 99 > OR = 60 fO/fpw/8.26f7   BUN/Creatinine Ratio SEE NOTE: 6 - 22 (calc)   Sodium 137 135 - 146 mmol/L   Potassium 4.2 3.5 - 5.3 mmol/L   Chloride 100 98 - 110 mmol/L   CO2 31 20 - 32 mmol/L   Calcium 9.3 8.6 - 10.4 mg/dL   Total Protein 7.9 6.1 - 8.1 g/dL   Albumin 4.4 3.6 - 5.1 g/dL   Globulin 3.5 1.9 - 3.7 g/dL (calc)   AG Ratio 1.3 1.0 - 2.5 (calc)   Total Bilirubin 0.5 0.2 - 1.2 mg/dL   Alkaline phosphatase (APISO) 53 37 - 153 U/L   AST 19 10 - 35 U/L   ALT 12 6 - 29 U/L      Assessment & Plan:   Problem List Items Addressed This Visit     Bilateral hand pain   Relevant Medications   meloxicam  (MOBIC ) 7.5 MG tablet   Essential hypertension   Relevant Medications   losartan  (COZAAR ) 100 MG tablet   hydrochlorothiazide  (HYDRODIURIL ) 25 MG tablet   Osteoarthritis   Relevant Medications   meloxicam  (MOBIC ) 7.5 MG tablet   Screening for breast cancer   Relevant Orders   MM 3D SCREENING MAMMOGRAM BILATERAL BREAST   Other Visit Diagnoses       Annual physical exam    -  Primary     Postmenopausal estrogen deficiency       Relevant Orders   DG Bone Density        Updated Health Maintenance information Reviewed recent lab results with patient Encouraged improvement to lifestyle with diet and exercise Goal of weight loss   Adult Wellness Visit Annual wellness visit with no significant health issues. Cardiac calcium score of zero indicates no coronary artery disease. Normal mammogram in December. Stable A1c  and cholesterol levels over four years. No anemia or thyroid  issues. - Schedule mammogram for December. - Schedule bone density scan (DEXA). - Continue current medications: meloxicam , losartan , hydrochlorothiazide . - Continue multivitamin, Z Omega, and Alpha CRS. - Maintain current diet and lifestyle with focus on reducing gluten and red meat intake.  Essential hypertension Blood pressure is well-controlled at 132/72 mmHg with current medication regimen. - Continue losartan  and hydrochlorothiazide .  Hyperlipidemia  Cholesterol levels have improved with a consistent 20-point reduction over five years. No coronary artery disease detected on cardiac calcium scan, reducing immediate cardiovascular risk. Discussed genetics' role in cholesterol levels and importance of lifestyle modifications. - Continue current diet and lifestyle modifications.  Impaired fasting glucose Stable A1c over four years. Dietary habits include high fruit intake and occasional gluten and red meat, potentially affecting glucose levels. Discussed dietary vigilance to prevent glucose fluctuations. - Maintain current diet with focus on reducing gluten and red meat. - Monitor blood glucose levels regularly.  Primary generalized osteoarthritis with right hand pain Chronic osteoarthritis with right hand pain, joint swelling, and pain exacerbated by certain activities. Topical treatments provide relief. - Continue using topical treatments for joint pain. - Avoid activities that exacerbate symptoms.  General Health Maintenance Discussion on vaccinations and screenings. Pneumonia vaccine (Prevnar 20) recommended due to age eligibility and long-term protection against bacterial pneumonia. Considering flu and shingles vaccines. Colonoscopy due in two years. Bone density scan recommended for osteoporosis screening. - Administer Prevnar 20 vaccine when ready. - Consider flu and shingles vaccines. - Schedule colonoscopy in two  years. - Schedule bone density scan (DEXA).  Follow-up Routine follow-up and scheduling discussed. Emphasized importance of staying up to date with health screenings and vaccinations. - Schedule next annual wellness visit. - Call for scheduling pneumonia vaccine administration. - Coordinate mammogram and bone density scan appointments.         Orders Placed This Encounter  Procedures   DG Bone Density    Standing Status:   Future    Expiration Date:   08/04/2025    Reason for Exam (SYMPTOM  OR DIAGNOSIS REQUIRED):   postmenopausal estrogen deficiency OP screening    Preferred imaging location?:   [facility]   MM 3D SCREENING MAMMOGRAM BILATERAL BREAST    Standing Status:   Future    Expiration Date:   08/04/2025    Reason for Exam (SYMPTOM  OR DIAGNOSIS REQUIRED):   Screening bilateral 3D Mammogram Tomo    Preferred imaging location?:   [facility]    Meds ordered this encounter  Medications   meloxicam  (MOBIC ) 7.5 MG tablet    Sig: Take 1 tablet (7.5 mg total) by mouth daily as needed for pain.    Dispense:  90 tablet    Refill:  3   losartan  (COZAAR ) 100 MG tablet    Sig: Take 1 tablet (100 mg total) by mouth daily.    Dispense:  90 tablet    Refill:  3   hydrochlorothiazide  (HYDRODIURIL ) 25 MG tablet    Sig: Take 1 tablet (25 mg total) by mouth daily.    Dispense:  90 tablet    Refill:  3    Add refills     Follow up plan: Return for 1 year fasting lab > 1 week later Annual Physical.  08/04/2025   Marsa Officer, DO [facility] 08/04/2024, 9:05 AM

## 2024-08-04 NOTE — Patient Instructions (Addendum)
 Thank you for coming to the office today.  Recent Labs    07/28/24 1005  HGBA1C 5.8*   Prevnar-20 vaccine pnuemonia shot in future lasts 5-10 years. Here or at pharmacy  Cholesterol sugar all look stable from previous Keep improving lifestyle  For Mammogram screening for breast cancer AND DEXA Scan (Bone mineral density) screening for osteoporosis  Call the Imaging Center below anytime to schedule your own appointment now that order has been placed.  [facility] [address], [address] [place], [place] [address] Phone: [phone]   Please schedule a Follow-up Appointment to: Return for 1 year fasting lab > 1 week later Annual Physical.  If you have any other questions or concerns, please feel free to call the office or send a message through MyChart. You may also schedule an earlier appointment if necessary.  Additionally, you may be receiving a survey about your experience at our office within a few days to 1 week by e-mail or mail. We value your feedback.  Marsa Officer, DO [facility], [place]

## 2025-08-04 ENCOUNTER — Other Ambulatory Visit

## 2025-08-10 ENCOUNTER — Encounter: Admitting: Family Medicine
